# Patient Record
Sex: Female | Born: 1985 | Race: Black or African American | Hispanic: No | Marital: Single | Smoking: Never smoker
Health system: Southern US, Community
[De-identification: ages and names within clinical notes are randomized; demographics above are authoritative.]

## PROBLEM LIST (undated history)

## (undated) DIAGNOSIS — E282 Polycystic ovarian syndrome: Secondary | ICD-10-CM

## (undated) DIAGNOSIS — M51369 Other intervertebral disc degeneration, lumbar region without mention of lumbar back pain or lower extremity pain: Secondary | ICD-10-CM

## (undated) DIAGNOSIS — M5136 Other intervertebral disc degeneration, lumbar region: Secondary | ICD-10-CM

## (undated) HISTORY — PX: LIPOSUCTION: SHX10

## (undated) HISTORY — DX: Other intervertebral disc degeneration, lumbar region: M51.36

## (undated) HISTORY — DX: Other intervertebral disc degeneration, lumbar region without mention of lumbar back pain or lower extremity pain: M51.369

---

## 2007-09-05 ENCOUNTER — Emergency Department (HOSPITAL_COMMUNITY): Admission: EM | Admit: 2007-09-05 | Discharge: 2007-09-05 | Payer: Self-pay | Admitting: Family Medicine

## 2007-09-28 ENCOUNTER — Ambulatory Visit: Payer: Self-pay | Admitting: Internal Medicine

## 2007-10-26 ENCOUNTER — Ambulatory Visit: Payer: Self-pay | Admitting: *Deleted

## 2008-02-20 ENCOUNTER — Ambulatory Visit: Payer: Self-pay | Admitting: Internal Medicine

## 2009-04-30 ENCOUNTER — Emergency Department: Payer: Self-pay | Admitting: Emergency Medicine

## 2009-05-06 ENCOUNTER — Emergency Department: Payer: Self-pay | Admitting: Emergency Medicine

## 2009-09-30 ENCOUNTER — Ambulatory Visit: Payer: Self-pay | Admitting: Family Medicine

## 2009-09-30 ENCOUNTER — Encounter (INDEPENDENT_AMBULATORY_CARE_PROVIDER_SITE_OTHER): Payer: Self-pay | Admitting: Adult Health

## 2009-09-30 LAB — CONVERTED CEMR LAB
Calcium: 8.5 mg/dL (ref 8.4–10.5)
Creatinine, Ser: 0.84 mg/dL (ref 0.40–1.20)
Eosinophils Absolute: 0.1 10*3/uL (ref 0.0–0.7)
Lymphocytes Relative: 19 % (ref 12–46)
MCHC: 33.4 g/dL (ref 30.0–36.0)
MCV: 90.8 fL (ref 78.0–100.0)
Monocytes Relative: 8 % (ref 3–12)
Neutro Abs: 10.4 10*3/uL — ABNORMAL HIGH (ref 1.7–7.7)
Platelets: 303 10*3/uL (ref 150–400)
RBC: 4.65 M/uL (ref 3.87–5.11)
RDW: 13 % (ref 11.5–15.5)
TSH: 2.006 microintl units/mL (ref 0.350–4.500)
Total Bilirubin: 0.4 mg/dL (ref 0.3–1.2)
Total Protein: 7.3 g/dL (ref 6.0–8.3)
Vit D, 25-Hydroxy: 31 ng/mL (ref 30–89)
WBC: 14.3 10*3/uL — ABNORMAL HIGH (ref 4.0–10.5)

## 2009-10-07 ENCOUNTER — Ambulatory Visit (HOSPITAL_COMMUNITY): Admission: RE | Admit: 2009-10-07 | Discharge: 2009-10-07 | Payer: Self-pay | Admitting: Internal Medicine

## 2009-10-23 ENCOUNTER — Emergency Department (HOSPITAL_COMMUNITY): Admission: EM | Admit: 2009-10-23 | Discharge: 2009-10-23 | Payer: Self-pay | Admitting: Emergency Medicine

## 2009-10-23 ENCOUNTER — Emergency Department (HOSPITAL_COMMUNITY): Admission: EM | Admit: 2009-10-23 | Discharge: 2009-10-23 | Payer: Self-pay | Admitting: Family Medicine

## 2009-10-28 ENCOUNTER — Encounter (INDEPENDENT_AMBULATORY_CARE_PROVIDER_SITE_OTHER): Payer: Self-pay | Admitting: Adult Health

## 2009-10-28 ENCOUNTER — Ambulatory Visit: Payer: Self-pay | Admitting: Internal Medicine

## 2009-10-28 LAB — CONVERTED CEMR LAB
Chlamydia, DNA Probe: NEGATIVE
Hgb A1c MFr Bld: 5.8 % — ABNORMAL HIGH (ref <5.7)
Microalb, Ur: 1.86 mg/dL (ref 0.00–1.89)
Testosterone: 77.62 ng/dL — ABNORMAL HIGH (ref 10–70)

## 2010-08-03 LAB — BASIC METABOLIC PANEL
CO2: 28 mEq/L (ref 19–32)
Calcium: 9.4 mg/dL (ref 8.4–10.5)
Chloride: 107 mEq/L (ref 96–112)
GFR calc non Af Amer: >60 mL/min (ref >60)
Potassium: 3.7 mEq/L (ref 3.5–5.1)
Sodium: 140 mEq/L (ref 135–145)

## 2010-08-03 LAB — DIFFERENTIAL
Basophils Relative: 0 % (ref 0–1)
Eosinophils Absolute: 0 10*3/uL (ref 0.0–0.7)
Lymphocytes Relative: 31 % (ref 12–46)
Neutrophils Relative %: 62 % (ref 43–77)

## 2010-08-03 LAB — POCT CARDIAC MARKERS
Myoglobin, poc: 73.1 ng/mL (ref 12–200)
Troponin i, poc: <0.05 ng/mL (ref 0.00–0.09)

## 2010-08-03 LAB — CBC
Hemoglobin: 13.9 g/dL (ref 12.0–15.0)
MCHC: 34 g/dL (ref 30.0–36.0)
MCV: 91.7 fL (ref 78.0–100.0)

## 2011-11-08 ENCOUNTER — Encounter (HOSPITAL_COMMUNITY): Payer: Self-pay

## 2011-11-08 ENCOUNTER — Emergency Department (HOSPITAL_COMMUNITY)
Admission: EM | Admit: 2011-11-08 | Discharge: 2011-11-08 | Disposition: A | Discharge status: Home / Self Care | Payer: Self-pay | Source: Home / Self Care | Attending: Emergency Medicine | Admitting: Emergency Medicine

## 2011-11-08 DIAGNOSIS — S335XXA Sprain of ligaments of lumbar spine, initial encounter: Secondary | ICD-10-CM

## 2011-11-08 DIAGNOSIS — S39012A Strain of muscle, fascia and tendon of lower back, initial encounter: Secondary | ICD-10-CM

## 2011-11-08 MED ORDER — KETOROLAC TROMETHAMINE 60 MG/2ML IM SOLN
INTRAMUSCULAR | Status: AC
  Filled 2011-11-08: qty 2

## 2011-11-08 MED ORDER — PREDNISONE 5 MG PO KIT: 1.0000 | PACK | Freq: Every day | ORAL | Status: DC

## 2011-11-08 MED ORDER — MELOXICAM 15 MG PO TABS: 15.0000 mg | ORAL_TABLET | Freq: Every day | ORAL | Status: AC

## 2011-11-08 MED ORDER — KETOROLAC TROMETHAMINE 60 MG/2ML IM SOLN
60.0000 mg | Freq: Once | INTRAMUSCULAR | Status: AC
  Administered 2011-11-08: 60 mg via INTRAMUSCULAR

## 2011-11-08 MED ORDER — CYCLOBENZAPRINE HCL 5 MG PO TABS: 5.0000 mg | ORAL_TABLET | Freq: Three times a day (TID) | ORAL | Status: AC | PRN

## 2011-11-08 MED ORDER — HYDROCODONE-ACETAMINOPHEN 5-325 MG PO TABS: ORAL_TABLET | ORAL | Status: AC

## 2011-11-08 NOTE — ED Notes (Signed)
C/o pain low back pain off and on for 2 months; pain in low back only, denies any radiation of pain, denies any poss of pregnancy ; looks uncomfortable; minimal relief w absorbine junior
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
## 2011-11-08 NOTE — Discharge Instructions (Signed)
Back Exercises Back exercises help treat and prevent back injuries. The goal of back exercises is to increase the strength of your abdominal and back muscles and the flexibility of your back. These exercises should be started when you no longer have back pain. Back exercises include:  Pelvic Tilt. Lie on your back with your knees bent. Tilt your pelvis until the lower part of your back is against the floor. Hold this position 5 to 10 sec and repeat 5 to 10 times.   Knee to Chest. Pull first 1 knee up against your chest and hold for 20 to 30 seconds, repeat this with the other knee, and then both knees. This may be done with the other leg straight or bent, whichever feels better.   Sit-Ups or Curl-Ups. Bend your knees 90 degrees. Start with tilting your pelvis, and do a partial, slow sit-up, lifting your trunk only 30 to 45 degrees off the floor. Take at least 2 to 3 seconds for each sit-up. Do not do sit-ups with your knees out straight. If partial sit-ups are difficult, simply do the above but with only tightening your abdominal muscles and holding it as directed.   Hip-Lift. Lie on your back with your knees flexed 90 degrees. Push down with your feet and shoulders as you raise your hips a couple inches off the floor; hold for 10 seconds, repeat 5 to 10 times.   Back arches. Lie on your stomach, propping yourself up on bent elbows. Slowly press on your hands, causing an arch in your low back. Repeat 3 to 5 times. Any initial stiffness and discomfort should lessen with repetition over time.   Shoulder-Lifts. Lie face down with arms beside your body. Keep hips and torso pressed to floor as you slowly lift your head and shoulders off the floor.  Do not overdo your exercises, especially in the beginning. Exercises may cause you some mild back discomfort which lasts for a few minutes; however, if the pain is more severe, or lasts for more than 15 minutes, do not continue exercises until you see your  caregiver. Improvement with exercise therapy for back problems is slow.  See your caregivers for assistance with developing a proper back exercise program. Document Released: 06/10/2004 Document Revised: 04/22/2011 Document Reviewed: 05/03/2005 [employer] Patient Information 2012 [employer]. 

## 2012-09-28 ENCOUNTER — Emergency Department (INDEPENDENT_AMBULATORY_CARE_PROVIDER_SITE_OTHER): Payer: No Typology Code available for payment source

## 2012-09-28 ENCOUNTER — Encounter (HOSPITAL_COMMUNITY): Payer: Self-pay | Admitting: *Deleted

## 2012-09-28 ENCOUNTER — Emergency Department (HOSPITAL_COMMUNITY)
Admission: EM | Admit: 2012-09-28 | Discharge: 2012-09-28 | Disposition: A | Discharge status: Home / Self Care | Payer: No Typology Code available for payment source | Source: Home / Self Care | Attending: Emergency Medicine | Admitting: Emergency Medicine

## 2012-09-28 DIAGNOSIS — S63501A Unspecified sprain of right wrist, initial encounter: Secondary | ICD-10-CM

## 2012-09-28 DIAGNOSIS — S63509A Unspecified sprain of unspecified wrist, initial encounter: Secondary | ICD-10-CM

## 2012-09-28 MED ORDER — HYDROCODONE-ACETAMINOPHEN 5-325 MG PO TABS
1.0000 | ORAL_TABLET | Freq: Once | ORAL | Status: AC
  Administered 2012-09-28: 1 via ORAL

## 2012-09-28 MED ORDER — IBUPROFEN 800 MG PO TABS
800.0000 mg | ORAL_TABLET | Freq: Once | ORAL | Status: AC
  Administered 2012-09-28: 800 mg via ORAL

## 2012-09-28 MED ORDER — MELOXICAM 15 MG PO TABS: 15.0000 mg | ORAL_TABLET | Freq: Every day | ORAL | Status: DC

## 2012-09-28 MED ORDER — IBUPROFEN 800 MG PO TABS
ORAL_TABLET | ORAL | Status: AC
  Filled 2012-09-28: qty 1

## 2012-09-28 MED ORDER — OXYCODONE-ACETAMINOPHEN 5-325 MG PO TABS: ORAL_TABLET | ORAL | Status: DC

## 2012-09-28 MED ORDER — HYDROCODONE-ACETAMINOPHEN 5-325 MG PO TABS
ORAL_TABLET | ORAL | Status: AC
  Filled 2012-09-28: qty 1

## 2012-09-28 NOTE — ED Notes (Signed)
[facility]  r   Wrist  Splint

## 2012-09-28 NOTE — ED Notes (Signed)
Pt  Reports  She  Injured  Her  r     Wrist  sev  Days  Ago    When she  Larey Seat      She  Reports    Old  Injury  approx  3  Months  Ago         And  Was  Not  Checked  For that  Injury     Symptoms  Not  releived  By  Wrist  Brace     /  Ice

## 2012-09-28 NOTE — ED Provider Notes (Signed)
Chief Complaint:   Chief Complaint  Patient presents with  . Wrist Pain    History of Present Illness:   Lisa Watson is a 27 year old female who has had a three-month history of intermittent pain in the right wrist. The pain is localized over the dorsum of the wrist both on radial and ulnar side. This began after she fell on outstretched hand on the ice 3 months ago. The wrist hurt intermittently thereafter. She reinjured it 2 days ago when she fell off her bed. The wrist is somewhat swollen. She has a fairly good range of motion. She is able to move her digits well and denies any numbness, tingling, or weakness.  Review of Systems:  Other than noted above, the patient denies any of the following symptoms: Systemic:  No fevers, chills, sweats, or aches.  No fatigue or tiredness. Musculoskeletal:  No joint pain, arthritis, bursitis, swelling, back pain, or neck pain. Neurological:  No muscular weakness, paresthesias, headache, or trouble with speech or coordination.  No dizziness.  PMFSH:  Past medical history, family history, social history, meds, and allergies were reviewed.  She is allergic to tramadol. She is able to take other opioids.  Physical Exam:   Vital signs:  BP 118/72  Pulse 72  Temp(Src) 98.6 F (37 C) (Oral)  Resp 14  SpO2 100%  LMP 09/18/2012 Gen:  Alert and oriented times 3.  In no distress. Musculoskeletal: She has mild pain to palpation of the distal radius and the distal ulna. There is no visible swelling or deformity. Wrist has a full range of motion both actively and passively with minimal pain.  Otherwise, all joints had a full a ROM with no swelling, bruising or deformity.  No edema, pulses full. Extremities were warm and pink.  Capillary refill was brisk.  Skin:  Clear, warm and dry.  No rash. Neuro:  Alert and oriented times 3.  Muscle strength was normal.  Sensation was intact to light touch.   Radiology:  Dg Wrist Complete Right  09/28/2012   *RADIOLOGY  REPORT*  Clinical Data: Status post fall.  Right wrist pain.  RIGHT WRIST - COMPLETE 3+ VIEW  Comparison: None.  Findings: Imaged bones, joints and soft tissues appear normal.  IMPRESSION: Negative study.   Original Report Authenticated By: Holley Dexter, M.D.   I reviewed the images independently and personally and concur with the radiologist's findings.  Course in Urgent Care Center:   Given Norco 5/325 ibuprofen 800 mg by mouth for pain. She was placed in a wrist splint, and should leave this in place for a month except for bathing and showering, then removed and start on some wrist rehabilitation exercises.  Assessment:  The encounter diagnosis was Wrist sprain, right, initial encounter.  Plan:   1.  The following meds were prescribed:   New Prescriptions   MELOXICAM (MOBIC) 15 MG TABLET    Take 1 tablet (15 mg total) by mouth daily.   OXYCODONE-ACETAMINOPHEN (PERCOCET) 5-325 MG PER TABLET    1 to 2 tablets every 6 hours as needed for pain.   2.  The patient was instructed in symptomatic care, including rest and activity, elevation, application of ice and compression.  Appropriate handouts were given. 3.  The patient was told to return if becoming worse in any way, if no better in 3 or 4 days, and given some red flag symptoms such as worsening pain or neurological symptoms that would indicate earlier return.   4.  The patient  was told to follow up with Dr. Betha Loa in one week.    Reuben Likes, MD 09/28/12 [phone]

## 2013-10-09 ENCOUNTER — Encounter (HOSPITAL_COMMUNITY): Payer: Self-pay | Admitting: Emergency Medicine

## 2013-10-09 ENCOUNTER — Emergency Department (INDEPENDENT_AMBULATORY_CARE_PROVIDER_SITE_OTHER)
Admission: EM | Admit: 2013-10-09 | Discharge: 2013-10-09 | Disposition: A | Discharge status: Home / Self Care | Payer: Self-pay | Source: Home / Self Care | Attending: Family Medicine | Admitting: Family Medicine

## 2013-10-09 DIAGNOSIS — R110 Nausea: Secondary | ICD-10-CM

## 2013-10-09 DIAGNOSIS — R109 Unspecified abdominal pain: Secondary | ICD-10-CM

## 2013-10-09 DIAGNOSIS — R509 Fever, unspecified: Secondary | ICD-10-CM

## 2013-10-09 DIAGNOSIS — M791 Myalgia, unspecified site: Secondary | ICD-10-CM

## 2013-10-09 DIAGNOSIS — IMO0001 Reserved for inherently not codable concepts without codable children: Secondary | ICD-10-CM

## 2013-10-09 DIAGNOSIS — E860 Dehydration: Secondary | ICD-10-CM

## 2013-10-09 LAB — POCT URINALYSIS DIP (DEVICE)
Glucose, UA: NEGATIVE mg/dL
HGB URINE DIPSTICK: NEGATIVE
KETONES UR: 40 mg/dL — AB
LEUKOCYTES UA: NEGATIVE
NITRITE: NEGATIVE
PROTEIN: 30 mg/dL — AB
Specific Gravity, Urine: >=1.03 (ref 1.005–1.030)
Urobilinogen, UA: 2 mg/dL — ABNORMAL HIGH (ref 0.0–1.0)
pH: 6 (ref 5.0–8.0)

## 2013-10-09 LAB — POCT PREGNANCY, URINE: Preg Test, Ur: NEGATIVE

## 2013-10-09 MED ORDER — ONDANSETRON 8 MG PO TBDP: 8.0000 mg | ORAL_TABLET | Freq: Three times a day (TID) | ORAL | Status: DC | PRN

## 2013-10-09 MED ORDER — ONDANSETRON HCL 4 MG/2ML IJ SOLN: 4.0000 mg | Freq: Once | INTRAMUSCULAR | Status: DC

## 2013-10-09 MED ORDER — PROMETHAZINE HCL 25 MG PO TABS: 25.0000 mg | ORAL_TABLET | Freq: Four times a day (QID) | ORAL | Status: DC | PRN

## 2013-10-09 MED ORDER — KETOROLAC TROMETHAMINE 30 MG/ML IJ SOLN
INTRAMUSCULAR | Status: AC
  Filled 2013-10-09: qty 1

## 2013-10-09 MED ORDER — KETOROLAC TROMETHAMINE 30 MG/ML IJ SOLN: 30.0000 mg | Freq: Once | INTRAMUSCULAR | Status: DC

## 2013-10-09 MED ORDER — ONDANSETRON HCL 4 MG/2ML IJ SOLN
INTRAMUSCULAR | Status: AC
  Filled 2013-10-09: qty 2

## 2013-10-09 MED ORDER — ONDANSETRON HCL 4 MG/2ML IJ SOLN
4.0000 mg | Freq: Once | INTRAMUSCULAR | Status: AC
  Administered 2013-10-09: 4 mg via INTRAVENOUS

## 2013-10-09 MED ORDER — SODIUM CHLORIDE 0.9 % IV SOLN
Freq: Once | INTRAVENOUS | Status: AC
Start: 2013-10-09 — End: 2013-10-09
  Administered 2013-10-09: 21:00:00 via INTRAVENOUS

## 2013-10-09 MED ORDER — KETOROLAC TROMETHAMINE 30 MG/ML IJ SOLN
30.0000 mg | Freq: Once | INTRAMUSCULAR | Status: AC
  Administered 2013-10-09: 30 mg via INTRAMUSCULAR

## 2013-10-09 NOTE — ED Provider Notes (Signed)
CSN: [id number]     Arrival date & time 10/09/13  1855 History   First MD Initiated Contact with Patient 10/09/13 1932     Chief Complaint  Patient presents with  . Emesis  . Diarrhea   (Consider location/radiation/quality/duration/timing/severity/associated sxs/prior Treatment) HPI  Patient presents with a two-day history of nausea, vomiting, diarrhea, and abdominal pain. This started suddenly after eating picnic dinner of hot dog, watermelon, and chips yesterday. Last night she had vomiting x5 and diarrhea x5 the vomiting was nonbloody nonbilious, and the diarrhea was nonbloody and not dark. It was accompanied by headache, backache, and abdominal pain. She has tried to take Pepto-Bismol and drink sodas a day, but has not been able to. Her father and brother are also sick.  History reviewed. No pertinent past medical history. History reviewed. No pertinent past surgical history. Family History  Problem Relation Age of Onset  . Cataracts Mother   . Heart failure Mother   . Diabetes Father   . Hypertension Father    History  Substance Use Topics  . Smoking status: Never Smoker   . Smokeless tobacco: Not on file  . Alcohol Use: No   OB History   Grav Para Term Preterm Abortions TAB SAB Ect Mult Living                 Review of Systems Positive for headache, fever, chills, abdominal pain, back pain, diarrhea Negative for hematemesis, melena, hematochezia, rash Allergies  Tramadol  Home Medications   Prior to Admission medications   Medication Sig Start Date End Date Taking? Authorizing Provider  bismuth subsalicylate (PEPTO BISMOL) 262 MG/15ML suspension Take 30 mLs by mouth every 6 (six) hours as needed for diarrhea or loose stools.   Yes Historical Provider, MD  meloxicam (MOBIC) 15 MG tablet Take 1 tablet (15 mg total) by mouth daily. 09/28/12   Harden Mo, MD  oxyCODONE-acetaminophen (PERCOCET) 5-325 MG per tablet 1 to 2 tablets every 6 hours as needed for pain.  09/28/12   Harden Mo, MD  PredniSONE 5 MG KIT Take 1 kit (5 mg total) by mouth daily after breakfast. Prednisone 5 mg 6 day dosepack.  Take as directed. 11/08/11   Harden Mo, MD   BP 117/82  Pulse 145  Temp(Src) 100.6 F (38.1 C) (Oral)  Resp 16  SpO2 99%  LMP 09/25/2013 Physical Exam Gen: Young African American female, ill-appearing, but nontoxic, pleasant HEENT: normocephalic atraumatic, oropharynx dry, no pharyngeal exudate, normal range of motion of neck Cardiovascular: sinus tachycardia Lungs: clear to auscultation bilaterally Back: generalized tenderness to palpation of paraspinal muscles and trapezius muscles without deformity Abdomen: soft, tenderness in right lower quadrant, without guarding or rigidity, hyperactive bowel sounds Skin: warm and dry ED Course  Procedures (including critical care time) Labs Review Labs Reviewed  POCT URINALYSIS DIP (DEVICE) - Abnormal; Notable for the following:    Bilirubin Urine SMALL (*)    Ketones, ur 40 (*)    Protein, ur 30 (*)    Urobilinogen, UA 2.0 (*)    All other components within normal limits  POCT PREGNANCY, URINE    Imaging Review No results found.   MDM   1. Dehydration   2. Myalgia   3. Abdominal pain   4. Nausea   5. Fever    Pt with at least 10% dehydration as noted by sinus tachycardia and not able to tolerate PO. Etiology most likely viral gastroenterology. Patient given a bolus of 1L of  normal saline, zofran IV, and toradol 30 mg IM.  She was feeling better and tachycardia improved. She tolerated PO Sprite and was appropriate for discharge.    Angelica Ran, MD 10/09/13 2041  Angelica Ran, MD 10/09/13 2157

## 2013-10-09 NOTE — ED Notes (Signed)
C/o vomiting and diarrhea onset 1800 yesterday.  V x 3, and D x 3-5 yesterday,  Vomiting x 1 and diarrhea x 1 today.  C/o abdominal pain and pain on her "whole back."  She did not know she was febrile until we checked it.  Is able to keep liquids down today.  Orthostatic with her pulses.

## 2013-10-09 NOTE — Discharge Instructions (Signed)
Lisa Watson,   I am glad that you are feeling a little better. Please continue to drink beverages when you get home tonight. You can eat bland foods like soup, crackers and bread. If you still have nausea, then please take the zofran. If that is too expensive, then you can fill the prescription for phenergan. Phenergan can make you very tired, so please do not take it before work or driving.   I hope you feel better soon.   Sincerely,   Dr. Clinton Sawyer

## 2013-10-12 NOTE — ED Provider Notes (Signed)
Medical screening examination/treatment/procedure(s) were performed by resident physician or non-physician practitioner and as supervising physician I was immediately available for consultation/collaboration.   KINDL,JAMES DOUGLAS MD.   James D Kindl, MD 10/12/13 1005 

## 2015-02-23 ENCOUNTER — Encounter: Payer: Self-pay | Admitting: Emergency Medicine

## 2015-02-23 ENCOUNTER — Emergency Department
Admission: EM | Admit: 2015-02-23 | Discharge: 2015-02-24 | Disposition: A | Discharge status: Home / Self Care | Payer: Self-pay | Attending: Student | Admitting: Student

## 2015-02-23 DIAGNOSIS — K219 Gastro-esophageal reflux disease without esophagitis: Secondary | ICD-10-CM

## 2015-02-23 DIAGNOSIS — Z3202 Encounter for pregnancy test, result negative: Secondary | ICD-10-CM | POA: Insufficient documentation

## 2015-02-23 DIAGNOSIS — R1013 Epigastric pain: Secondary | ICD-10-CM

## 2015-02-23 DIAGNOSIS — R52 Pain, unspecified: Secondary | ICD-10-CM

## 2015-02-23 DIAGNOSIS — Z791 Long term (current) use of non-steroidal anti-inflammatories (NSAID): Secondary | ICD-10-CM | POA: Insufficient documentation

## 2015-02-23 LAB — CBC WITH DIFFERENTIAL/PLATELET
Basophils Absolute: 0.1 10*3/uL (ref 0–0.1)
Basophils Relative: 1 %
Eosinophils Absolute: 0.2 10*3/uL (ref 0–0.7)
Eosinophils Relative: 2 %
HEMATOCRIT: 35.1 % (ref 35.0–47.0)
HEMOGLOBIN: 11.4 g/dL — AB (ref 12.0–16.0)
Lymphocytes Relative: 30 %
Lymphs Abs: 2.9 10*3/uL (ref 1.0–3.6)
MCH: 26.2 pg (ref 26.0–34.0)
MCHC: 32.5 g/dL (ref 32.0–36.0)
MCV: 80.6 fL (ref 80.0–100.0)
MONO ABS: 0.6 10*3/uL (ref 0.2–0.9)
Monocytes Relative: 7 %
NEUTROS ABS: 5.9 10*3/uL (ref 1.4–6.5)
Neutrophils Relative %: 60 %
Platelets: 316 10*3/uL (ref 150–440)
RBC: 4.35 MIL/uL (ref 3.80–5.20)
RDW: 16.2 % — AB (ref 11.5–14.5)
WBC: 9.8 10*3/uL (ref 3.6–11.0)

## 2015-02-23 NOTE — ED Notes (Signed)
IV attempt to left AC unsuccessful; able to draw blood but could not thread catheter completely or flush; removed and pressure dressing applied; pt tolerated well

## 2015-02-23 NOTE — ED Notes (Signed)
Pt c/o epigastric pain that started around 830 this am; radiated through to her back intermittently; no nausea/vomiting; denies diarrhea; pt says she "ate a lot today" which didn't make the pain better or worse; no history of similar pain

## 2015-02-23 NOTE — ED Provider Notes (Signed)
[facility] Emergency Department Provider Note  ____________________________________________  Time seen: Approximately 11:34 PM  I have reviewed the triage vital signs and the nursing notes.   HISTORY  Chief Complaint Abdominal Pain    HPI Lisa Watson is a 29 y.o. female with chronic medical problems who presents for evaluation of gradual onset cramping epigastric abdominal pain that began yesterday, intermittent, currently moderate. She reports it improved after she ate some chocolate however it has recurred. No nausea, vomiting, fevers, chills or dysuria. No chest pain, no difficulty breathing. She has never had this before.   History reviewed. No pertinent past medical history.  There are no active problems to display for this patient.   History reviewed. No pertinent past surgical history.  Current Outpatient Rx  Name  Route  Sig  Dispense  Refill  . bismuth subsalicylate (PEPTO BISMOL) 262 MG/15ML suspension   Oral   Take 30 mLs by mouth every 6 (six) hours as needed for diarrhea or loose stools.         . meloxicam (MOBIC) 15 MG tablet   Oral   Take 1 tablet (15 mg total) by mouth daily.   30 tablet   0   . ondansetron (ZOFRAN ODT) 8 MG disintegrating tablet   Oral   Take 1 tablet (8 mg total) by mouth every 8 (eight) hours as needed for nausea or vomiting.   20 tablet   0   . oxyCODONE-acetaminophen (PERCOCET) 5-325 MG per tablet      1 to 2 tablets every 6 hours as needed for pain.   20 tablet   0   . PredniSONE 5 MG KIT   Oral   Take 1 kit (5 mg total) by mouth daily after breakfast. Prednisone 5 mg 6 day dosepack.  Take as directed.   1 kit   0   . promethazine (PHENERGAN) 25 MG tablet   Oral   Take 1 tablet (25 mg total) by mouth every 6 (six) hours as needed for nausea or vomiting.   20 tablet   0     Allergies Tramadol  Family History  Problem Relation Age of Onset  . Cataracts Mother   . Heart failure  Mother   . Diabetes Father   . Hypertension Father     Social History Social History  Substance Use Topics  . Smoking status: Never Smoker   . Smokeless tobacco: None  . Alcohol Use: Yes     Comment: seldom    Review of Systems Constitutional: No fever/chills Eyes: No visual changes. ENT: No sore throat. Cardiovascular: Denies chest pain. Respiratory: Denies shortness of breath. Gastrointestinal: + abdominal pain.  No nausea, no vomiting.  No diarrhea.  No constipation. Genitourinary: Negative for dysuria. Musculoskeletal: Negative for back pain. Skin: Negative for rash. Neurological: Negative for headaches, focal weakness or numbness.  10-point ROS otherwise negative.  ____________________________________________   PHYSICAL EXAM:  VITAL SIGNS: ED Triage Vitals  Enc Vitals Group     BP 02/23/15 2314 139/86 mmHg     Pulse Rate 02/23/15 2314 72     Resp 02/23/15 2314 20     Temp 02/23/15 2314 98.4 F (36.9 C)     Temp Source 02/23/15 2314 Oral     SpO2 02/23/15 2314 100 %     Weight 02/23/15 2314 230 lb (104.327 kg)     Height 02/23/15 2314 _0  (1.702 m)     Head Cir --  Peak Flow --      Pain Score 02/23/15 2315 7     Pain Loc --      Pain Edu? --      Excl. in [place]? --     Constitutional: Alert and oriented. Well appearing and in no acute distress. Eyes: Conjunctivae are normal. PERRL. EOMI. Head: Atraumatic. Nose: No congestion/rhinnorhea. Mouth/Throat: Mucous membranes are moist.  Oropharynx non-erythematous. Neck: No stridor.  Cardiovascular: Normal rate, regular rhythm. Grossly normal heart sounds.  Good peripheral circulation. Respiratory: Normal respiratory effort.  No retractions. Lungs CTAB. Gastrointestinal: Normal bowel sounds. Soft with moderate tenderness to palpation in the epigastrium and the right upper quadrant. No distention. No CVA tenderness. Genitourinary: Deferred Musculoskeletal: No lower extremity tenderness nor edema.  No  joint effusions. Neurologic:  Normal speech and language. No gross focal neurologic deficits are appreciated.  Skin:  Skin is warm, dry and intact. No rash noted. Psychiatric: Mood and affect are normal. Speech and behavior are normal.  ____________________________________________   LABS (all labs ordered are listed, but only abnormal results are displayed)  Labs Reviewed  CBC WITH DIFFERENTIAL/PLATELET - Abnormal; Notable for the following:    Hemoglobin 11.4 (*)    RDW 16.2 (*)    All other components within normal limits  URINALYSIS COMPLETEWITH MICROSCOPIC ([facility] ONLY) - Abnormal; Notable for the following:    Color, Urine YELLOW (*)    APPearance CLEAR (*)    Leukocytes, UA TRACE (*)    Squamous Epithelial / LPF 0-5 (*)    All other components within normal limits  COMPREHENSIVE METABOLIC PANEL - Abnormal; Notable for the following:    Glucose, Bld 108 (*)    Total Bilirubin <0.1 (*)    Anion gap 4 (*)    All other components within normal limits  LIPASE, BLOOD  TROPONIN I  POC URINE PREG, ED  POCT PREGNANCY, URINE   ____________________________________________  EKG  ED ECG REPORT I, Joanne Gavel, the attending physician, personally viewed and interpreted this ECG.   Date: 02/24/2015  EKG Time: 23:19  Rate: 65  Rhythm:  sinus rhythm with marked sinus arrhythmia  Axis: normal  Intervals:none  ST&T Change: No acute ST elevation.  ____________________________________________  RADIOLOGY  Right upper quadrant ultrasound IMPRESSION: Normal examination. ____________________________________________   PROCEDURES  Procedure(s) performed: None  Critical Care performed: No  ____________________________________________   INITIAL IMPRESSION / ASSESSMENT AND PLAN / ED COURSE  Pertinent labs & imaging results that were available during my care of the patient were reviewed by me and considered in my medical decision making (see chart for details).  Lisa Watson is a 29 y.o. female with chronic medical problems who presents for evaluation of gradual onset cramping epigastric abdominal pain that began yesterday, intermittent, currently moderate. On exam, she is very well-appearing and in no acute distress. Vital signs stable, she is afebrile. She does have tenderness to the patient in the epigastrium and right upper quadrant. Suspect possible acute gallbladder pathology vs GERD. Plan for screening labs, RUQ ultrasound, UA and upreg. Will treat symptomatically. Reassess for disposition.  ----------------------------------------- 2:09 AM on 02/24/2015 -----------------------------------------  Labs reviewed. CBC with a mild anemia, hemoglobin 11.4. Troponin negative. Normal CMP. Normal lipase. Negative pregnancy test. Urinalysis not consistent with infection. Right upper quadrant ultrasound negative. Symptoms have improved at this time with GI cocktail. Suspect her symptoms are related to GERD. Discussed return precautions, need for close PCP follow-up and use of omeprazole. She is comfortable with the discharge  plan.   ____________________________________________   FINAL CLINICAL IMPRESSION(S) / ED DIAGNOSES  Final diagnoses:  Abdominal pain, epigastric  Pain  Gastroesophageal reflux disease, esophagitis presence not specified       Joanne Gavel, MD 02/24/15 0210

## 2015-02-24 ENCOUNTER — Emergency Department: Payer: Self-pay

## 2015-02-24 LAB — URINALYSIS COMPLETE WITH MICROSCOPIC (ARMC ONLY)
Bacteria, UA: NONE SEEN
Bilirubin Urine: NEGATIVE
GLUCOSE, UA: NEGATIVE mg/dL
HGB URINE DIPSTICK: NEGATIVE
Ketones, ur: NEGATIVE mg/dL
NITRITE: NEGATIVE
Protein, ur: NEGATIVE mg/dL
SPECIFIC GRAVITY, URINE: 1.029 (ref 1.005–1.030)
pH: 6 (ref 5.0–8.0)

## 2015-02-24 LAB — COMPREHENSIVE METABOLIC PANEL
ALK PHOS: 56 U/L (ref 38–126)
ALT: 18 U/L (ref 14–54)
AST: 19 U/L (ref 15–41)
Albumin: 3.9 g/dL (ref 3.5–5.0)
Anion gap: 4 — ABNORMAL LOW (ref 5–15)
BUN: 12 mg/dL (ref 6–20)
CALCIUM: 9 mg/dL (ref 8.9–10.3)
CO2: 27 mmol/L (ref 22–32)
CREATININE: 0.78 mg/dL (ref 0.44–1.00)
Chloride: 110 mmol/L (ref 101–111)
GFR calc non Af Amer: >60 mL/min (ref >60)
Glucose, Bld: 108 mg/dL — ABNORMAL HIGH (ref 65–99)
Potassium: 4 mmol/L (ref 3.5–5.1)
Sodium: 141 mmol/L (ref 135–145)
TOTAL PROTEIN: 7.1 g/dL (ref 6.5–8.1)

## 2015-02-24 LAB — LIPASE, BLOOD: LIPASE: 27 U/L (ref 22–51)

## 2015-02-24 LAB — POCT PREGNANCY, URINE: PREG TEST UR: NEGATIVE

## 2015-02-24 LAB — TROPONIN I: Troponin I: <0.03 ng/mL (ref <0.031)

## 2015-02-24 MED ORDER — OMEPRAZOLE 20 MG PO CPDR: 20.0000 mg | DELAYED_RELEASE_CAPSULE | Freq: Every day | ORAL | Status: DC

## 2015-02-24 MED ORDER — GI COCKTAIL ~~LOC~~
30.0000 mL | Freq: Once | ORAL | Status: AC
  Administered 2015-02-24: 30 mL via ORAL
  Filled 2015-02-24: qty 30

## 2015-02-24 MED ORDER — ACETAMINOPHEN 500 MG PO TABS
1000.0000 mg | ORAL_TABLET | Freq: Once | ORAL | Status: AC
  Administered 2015-02-24: 1000 mg via ORAL
  Filled 2015-02-24: qty 2

## 2015-02-24 NOTE — ED Notes (Signed)
Patient transported to Ultrasound 

## 2018-05-17 ENCOUNTER — Ambulatory Visit (HOSPITAL_COMMUNITY)
Admission: EM | Admit: 2018-05-17 | Discharge: 2018-05-17 | Disposition: A | Discharge status: Home / Self Care | Payer: Self-pay | Attending: Family Medicine | Admitting: Family Medicine

## 2018-05-17 ENCOUNTER — Other Ambulatory Visit: Payer: Self-pay

## 2018-05-17 ENCOUNTER — Encounter (HOSPITAL_COMMUNITY): Payer: Self-pay | Admitting: Emergency Medicine

## 2018-05-17 DIAGNOSIS — R519 Headache, unspecified: Secondary | ICD-10-CM

## 2018-05-17 DIAGNOSIS — R51 Headache: Secondary | ICD-10-CM | POA: Insufficient documentation

## 2018-05-17 DIAGNOSIS — R6889 Other general symptoms and signs: Secondary | ICD-10-CM

## 2018-05-17 MED ORDER — KETOROLAC TROMETHAMINE 60 MG/2ML IM SOLN
INTRAMUSCULAR | Status: AC
  Filled 2018-05-17: qty 2

## 2018-05-17 MED ORDER — KETOROLAC TROMETHAMINE 60 MG/2ML IM SOLN
60.0000 mg | Freq: Once | INTRAMUSCULAR | Status: AC
  Administered 2018-05-17: 60 mg via INTRAMUSCULAR

## 2018-05-17 MED ORDER — OSELTAMIVIR PHOSPHATE 75 MG PO CAPS: 75.0000 mg | ORAL_CAPSULE | Freq: Two times a day (BID) | ORAL | 0 refills | Status: DC

## 2018-05-17 NOTE — ED Triage Notes (Signed)
Onset today, headache and vomiting, and abdominal pain

## 2018-05-17 NOTE — Discharge Instructions (Addendum)
Toradol shot given in office for headache Get plenty of rest and push fluids Continue with tessalon Perles for cough Tamiflu for flu-like symptoms.  Take as directed and to completion Use OTC medications like ibuprofen or tylenol as needed fever or pain Follow up with PCP or with [facility] if symptoms persist Return or go to ER if you have any new or worsening symptoms fever, chills, nausea, vomiting, chest pain, cough, shortness of breath, wheezing, abdominal pain, changes in bowel or bladder habits, etc...  Discussed with patient and family if symptoms do not improve or worsening within the next 24 hours should go to ED for further evaluation

## 2018-05-17 NOTE — ED Provider Notes (Addendum)
[facility]   [id number] 05/17/18 Arrival Time: 1703   CC: Flu symptoms   SUBJECTIVE: History from: patient and family.  Franciso BendLeaia Rohleder is a 33 y.o. female who presents with abrupt onset of nasal congestion, runny nose, sore throat, cough, HA, nausea, and 2 episodes of vomiting that began 1 day ago.  Admits to positive sick exposure.  Has tried tessalon perles without relief.  Denies aggravating factors.  Reports previous symptoms in the past and diagnosed with viral illness.   Complains of associated chills.  Denies fever, chills, fatigue, sinus pain, SOB, wheezing, chest pain, changes in bowel or bladder habits.    Received flu shot this year: no.  ROS: As per HPI.  History reviewed. No pertinent past medical history. History reviewed. No pertinent surgical history. Allergies  Allergen Reactions  . Tramadol Itching   No current facility-administered medications on file prior to encounter.    Current Outpatient Medications on File Prior to Encounter  Medication Sig Dispense Refill  . benzonatate (TESSALON) 100 MG capsule Take by mouth 3 (three) times daily as needed for cough.    Marland Kitchen. omeprazole (PRILOSEC) 20 MG capsule Take 1 capsule (20 mg total) by mouth daily. 30 capsule 0   Social History   Socioeconomic History  . Marital status: Single    Spouse name: Not on file  . Number of children: Not on file  . Years of education: Not on file  . Highest education level: Not on file  Occupational History  . Not on file  Social Needs  . Financial resource strain: Not on file  . Food insecurity:    Worry: Not on file    Inability: Not on file  . Transportation needs:    Medical: Not on file    Non-medical: Not on file  Tobacco Use  . Smoking status: Never Smoker  Substance and Sexual Activity  . Alcohol use: Yes    Comment: seldom  . Drug use: No  . Sexual activity: Yes    Birth control/protection: None  Lifestyle  . Physical activity:    Days per week:  Not on file    Minutes per session: Not on file  . Stress: Not on file  Relationships  . Social connections:    Talks on phone: Not on file    Gets together: Not on file    Attends religious service: Not on file    Active member of club or organization: Not on file    Attends meetings of clubs or organizations: Not on file    Relationship status: Not on file  . Intimate partner violence:    Fear of current or ex partner: Not on file    Emotionally abused: Not on file    Physically abused: Not on file    Forced sexual activity: Not on file  Other Topics Concern  . Not on file  Social History Narrative  . Not on file   Family History  Problem Relation Age of Onset  . Cataracts Mother   . Heart failure Mother   . Diabetes Father   . Hypertension Father     OBJECTIVE:  Vitals:   05/17/18 1758  BP: 136/86  Pulse: 72  Resp: (!) 28  Temp: (!) 97.2 F (36.2 C)  TempSrc: Oral  SpO2: 100%     General appearance: alert; appears fatigued, laying on exam table with eyes closed, sits up for ENT exam, nontoxic; tolerating own secretions HEENT: NCAT; Ears: EACs clear, TMs  pearly gray; Eyes: PERRL.  EOM grossly intact. Nose: nares patent without rhinorrhea, Throat: oropharynx clear, tonsils non erythematous or enlarged, uvula midline  Neck: supple without LAD Lungs: normal respiratory effort; no respiratory distress; unlabored respirations, symmetrical air entry; cough: absent; no respiratory distress; CTAB Heart: regular rate and rhythm.  Radial pulses 2+ symmetrical bilaterally Abdomen: soft, nondistended, normal active bowel sounds; nontender to palpation; no guarding  Skin: warm and dry Psychological: alert and cooperative; normal mood and affect  ASSESSMENT & PLAN:  1. Flu-like symptoms   2. Acute nonintractable headache, unspecified headache type     Meds ordered this encounter  Medications  . ketorolac (TORADOL) injection 60 mg  . oseltamivir (TAMIFLU) 75 MG capsule      Sig: Take 1 capsule (75 mg total) by mouth every 12 (twelve) hours.    Dispense:  10 capsule    Refill:  0    Order Specific Question:   Supervising Provider    Answer:   Eustace Moore [[id number]]   Toradol shot given in office for headache Get plenty of rest and push fluids Continue with tessalon Perles for cough Tamiflu for flu-like symptoms.  Take as directed and to completion Use OTC medications like ibuprofen or tylenol as needed fever or pain Follow up with PCP or with [facility] if symptoms persist Return or go to ER if you have any new or worsening symptoms fever, chills, nausea, vomiting, chest pain, cough, shortness of breath, wheezing, abdominal pain, changes in bowel or bladder habits, etc...  Discussed with patient and family if symptoms do not improve or worsening within the next 24 hours should go to ED for further evaluation  Reviewed expectations re: course of current medical issues. Questions answered. Outlined signs and symptoms indicating need for more acute intervention. Patient verbalized understanding. After Visit Summary given.         Rennis Harding, PA-C 05/17/18 1907    Rennis Harding, PA-C 05/17/18 1909

## 2018-08-02 ENCOUNTER — Ambulatory Visit (HOSPITAL_COMMUNITY)
Admission: EM | Admit: 2018-08-02 | Discharge: 2018-08-02 | Disposition: A | Discharge status: Home / Self Care | Payer: Self-pay | Attending: Family Medicine | Admitting: Family Medicine

## 2018-08-02 ENCOUNTER — Encounter (HOSPITAL_COMMUNITY): Payer: Self-pay | Admitting: Emergency Medicine

## 2018-08-02 ENCOUNTER — Other Ambulatory Visit: Payer: Self-pay

## 2018-08-02 DIAGNOSIS — H9201 Otalgia, right ear: Secondary | ICD-10-CM

## 2018-08-02 DIAGNOSIS — R6889 Other general symptoms and signs: Secondary | ICD-10-CM

## 2018-08-02 MED ORDER — CHLORHEXIDINE GLUCONATE 0.12 % MT SOLN: 15.0000 mL | Freq: Two times a day (BID) | OROMUCOSAL | 0 refills | Status: DC

## 2018-08-02 MED ORDER — BENZONATATE 100 MG PO CAPS: 200.0000 mg | ORAL_CAPSULE | Freq: Three times a day (TID) | ORAL | 1 refills | Status: DC | PRN

## 2018-08-02 NOTE — ED Provider Notes (Signed)
[facility]    CSN: [id number] Arrival date & time: 08/02/18  1825     History   Chief Complaint Chief Complaint  Patient presents with  . URI    HPI Lisa Watson is a 33 y.o. female.   This is a 33 year old established most: Urgent care patient who presents with 1 week of fever and cough.     History reviewed. No pertinent past medical history.  There are no active problems to display for this patient.   History reviewed. No pertinent surgical history.  OB History   No obstetric history on file.      Home Medications    Prior to Admission medications   Medication Sig Start Date End Date Taking? Authorizing Provider  benzonatate (TESSALON) 100 MG capsule Take 2 capsules (200 mg total) by mouth 3 (three) times daily as needed for cough. 08/02/18   Elvina Sidle, MD  chlorhexidine (PERIDEX) 0.12 % solution Use as directed 15 mLs in the mouth or throat 2 (two) times daily. 08/02/18   Elvina Sidle, MD  omeprazole (PRILOSEC) 20 MG capsule Take 1 capsule (20 mg total) by mouth daily. 02/24/15 02/24/16  Gayla Doss, MD  oseltamivir (TAMIFLU) 75 MG capsule Take 1 capsule (75 mg total) by mouth every 12 (twelve) hours. 05/17/18   Rennis Harding, PA-C    Family History Family History  Problem Relation Age of Onset  . Cataracts Mother   . Heart failure Mother   . Diabetes Father   . Hypertension Father     Social History Social History   Tobacco Use  . Smoking status: Never Smoker  Substance Use Topics  . Alcohol use: Yes    Comment: seldom  . Drug use: No     Allergies   Tramadol   Review of Systems Review of Systems   Physical Exam Triage Vital Signs ED Triage Vitals  Enc Vitals Group     BP      Pulse      Resp      Temp      Temp src      SpO2      Weight      Height      Head Circumference      Peak Flow      Pain Score      Pain Loc      Pain Edu?      Excl. in GC?    No data found.  Updated Vital Signs  BP (!) 137/93 (BP Location: Left Arm) Comment: large cuff  Pulse 79   Temp 98.1 F (36.7 C) (Oral)   Resp 20   SpO2 100%    Physical Exam Vitals signs and nursing note reviewed.  Constitutional:      Appearance: Normal appearance.  HENT:     Right Ear: Tympanic membrane normal.     Left Ear: Tympanic membrane normal.     Nose: Nose normal.     Mouth/Throat:     Mouth: Mucous membranes are moist.     Comments: Right tonsillar concretion Eyes:     Conjunctiva/sclera: Conjunctivae normal.  Neck:     Musculoskeletal: Normal range of motion and neck supple.  Cardiovascular:     Rate and Rhythm: Normal rate and regular rhythm.     Pulses: Normal pulses.     Heart sounds: Normal heart sounds.  Pulmonary:     Effort: Pulmonary effort is normal.     Breath sounds: Normal  breath sounds.  Musculoskeletal: Normal range of motion.  Skin:    General: Skin is warm and dry.  Neurological:     General: No focal deficit present.     Mental Status: She is alert and oriented to person, place, and time.  Psychiatric:        Mood and Affect: Mood normal.        Behavior: Behavior normal.      UC Treatments / Results  Labs (all labs ordered are listed, but only abnormal results are displayed) Labs Reviewed - No data to display  EKG None  Radiology No results found.  Procedures Procedures (including critical care time)  Medications Ordered in UC Medications - No data to display  Initial Impression / Assessment and Plan / UC Course  I have reviewed the triage vital signs and the nursing notes.  Pertinent labs & imaging results that were available during my care of the patient were reviewed by me and considered in my medical decision making (see chart for details).    Final Clinical Impressions(s) / UC Diagnoses   Final diagnoses:  Flu-like symptoms  Otalgia of right ear     Discharge Instructions     Gargle twice daily for a week.  Expect symptoms to resolve over  the next several days    ED Prescriptions    Medication Sig Dispense Auth. Provider   benzonatate (TESSALON) 100 MG capsule Take 2 capsules (200 mg total) by mouth 3 (three) times daily as needed for cough. 20 capsule Elvina Sidle, MD   chlorhexidine (PERIDEX) 0.12 % solution Use as directed 15 mLs in the mouth or throat 2 (two) times daily. 120 mL Elvina Sidle, MD     Controlled Substance Prescriptions [place] Controlled Substance Registry consulted? Not Applicable   Elvina Sidle, MD 08/02/18 [phone]

## 2018-08-02 NOTE — Discharge Instructions (Addendum)
Gargle twice daily for a week.  Expect symptoms to resolve over the next several days

## 2018-08-02 NOTE — ED Triage Notes (Signed)
Dr Milus Glazier in room with nurse

## 2018-10-08 ENCOUNTER — Other Ambulatory Visit: Payer: Self-pay

## 2018-10-08 ENCOUNTER — Ambulatory Visit (HOSPITAL_COMMUNITY)
Admission: EM | Admit: 2018-10-08 | Discharge: 2018-10-08 | Disposition: A | Discharge status: Home / Self Care | Payer: Self-pay | Attending: Family Medicine | Admitting: Family Medicine

## 2018-10-08 ENCOUNTER — Encounter (HOSPITAL_COMMUNITY): Payer: Self-pay | Admitting: Emergency Medicine

## 2018-10-08 DIAGNOSIS — G43019 Migraine without aura, intractable, without status migrainosus: Secondary | ICD-10-CM

## 2018-10-08 HISTORY — DX: Polycystic ovarian syndrome: E28.2

## 2018-10-08 MED ORDER — KETOROLAC TROMETHAMINE 60 MG/2ML IM SOLN
60.0000 mg | Freq: Once | INTRAMUSCULAR | Status: AC
  Administered 2018-10-08: 16:00:00 60 mg via INTRAMUSCULAR

## 2018-10-08 MED ORDER — ALUM & MAG HYDROXIDE-SIMETH 200-200-20 MG/5ML PO SUSP
ORAL | Status: AC
  Filled 2018-10-08: qty 30

## 2018-10-08 MED ORDER — ALUM & MAG HYDROXIDE-SIMETH 200-200-20 MG/5ML PO SUSP
30.0000 mL | Freq: Once | ORAL | Status: AC
  Administered 2018-10-08: 16:00:00 30 mL via ORAL

## 2018-10-08 MED ORDER — METOCLOPRAMIDE HCL 5 MG/ML IJ SOLN
5.0000 mg | Freq: Once | INTRAMUSCULAR | Status: AC
  Administered 2018-10-08: 16:00:00 5 mg via INTRAMUSCULAR

## 2018-10-08 MED ORDER — LIDOCAINE VISCOUS HCL 2 % MT SOLN
OROMUCOSAL | Status: AC
  Filled 2018-10-08: qty 15

## 2018-10-08 MED ORDER — LIDOCAINE VISCOUS HCL 2 % MT SOLN
15.0000 mL | Freq: Once | OROMUCOSAL | Status: AC
  Administered 2018-10-08: 16:00:00 15 mL via ORAL

## 2018-10-08 MED ORDER — KETOROLAC TROMETHAMINE 60 MG/2ML IM SOLN
INTRAMUSCULAR | Status: AC
  Filled 2018-10-08: qty 2

## 2018-10-08 MED ORDER — METOCLOPRAMIDE HCL 5 MG/ML IJ SOLN
INTRAMUSCULAR | Status: AC
  Filled 2018-10-08: qty 2

## 2018-10-08 MED ORDER — SUMATRIPTAN SUCCINATE 50 MG PO TABS: ORAL_TABLET | ORAL | 0 refills | Status: DC

## 2018-10-08 MED ORDER — ONDANSETRON HCL 4 MG PO TABS: 4.0000 mg | ORAL_TABLET | Freq: Four times a day (QID) | ORAL | 0 refills | Status: DC

## 2018-10-08 NOTE — ED Provider Notes (Signed)
[facility]    CSN: [id number] Arrival date & time: 10/08/18  1456     History   Chief Complaint Chief Complaint  Patient presents with  . Headache  . Abdominal Pain    HPI Lisa Watson is a 33 y.o. female.   HPI  Patient has a history of migraine headaches.  She is here for what she considers to be a migraine.  Started yesterday.  She took some Excedrin and thought she was feeling better.  After this she did throw up.  She was able to sleep some last night but when she woke up this morning she still has a headache.  Is behind her eyes.  No visual changes.  She still has some nausea but no vomiting today.  She has some epigastric pain.  She states she also took a couple doses of Aleve for her headache.  She states that Aleve does not usually upset her stomach. Previously diagnosed with GERD.  Previously took omeprazole.  She states that she has not been on this for some time. Denies alcohol.  Past Medical History:  Diagnosis Date  . PCOS (polycystic ovarian syndrome)     There are no active problems to display for this patient.   History reviewed. No pertinent surgical history.  OB History   No obstetric history on file.      Home Medications    Prior to Admission medications   Medication Sig Start Date End Date Taking? Authorizing Provider  ondansetron (ZOFRAN) 4 MG tablet Take 1 tablet (4 mg total) by mouth every 6 (six) hours. As needed nausea/vomiting 10/08/18   Eustace Moore, MD  SUMAtriptan (IMITREX) 50 MG tablet Take one at first sign of migraine.  Repeat in 2 hours if needed 10/08/18   Eustace Moore, MD    Family History Family History  Problem Relation Age of Onset  . Cataracts Mother   . Heart failure Mother   . Diabetes Father   . Hypertension Father     Social History Social History   Tobacco Use  . Smoking status: Never Smoker  . Smokeless tobacco: Never Used  Substance Use Topics  . Alcohol use: Yes    Comment:  seldom  . Drug use: No     Allergies   Tramadol   Review of Systems Review of Systems  Constitutional: Negative for chills and fever.  HENT: Negative for ear pain and sore throat.   Eyes: Negative for pain and visual disturbance.  Respiratory: Negative for cough and shortness of breath.   Cardiovascular: Negative for chest pain and palpitations.  Gastrointestinal: Positive for abdominal pain, nausea and vomiting.  Genitourinary: Negative for dysuria and hematuria.  Musculoskeletal: Negative for arthralgias and back pain.  Skin: Negative for color change and rash.  Neurological: Positive for headaches. Negative for seizures and syncope.  All other systems reviewed and are negative.    Physical Exam Triage Vital Signs ED Triage Vitals  Enc Vitals Group     BP 10/08/18 1515 (!) 144/101     Pulse Rate 10/08/18 1515 100     Resp 10/08/18 1515 18     Temp 10/08/18 1515 98.5 F (36.9 C)     Temp Source 10/08/18 1515 Oral     SpO2 10/08/18 1515 99 %     Weight --      Height --      Head Circumference --      Peak Flow --  Pain Score 10/08/18 1511 7     Pain Loc --      Pain Edu? --      Excl. in GC? --    No data found.  Updated Vital Signs BP (!) 144/101 (BP Location: Right Arm)   Pulse 100   Temp 98.5 F (36.9 C) (Oral)   Resp 18   LMP 09/28/2018   SpO2 99%   Physical Exam Constitutional:      General: She is not in acute distress.    Appearance: She is well-developed. She is obese.     Comments: Appears mildly uncomfortable  HENT:     Head: Normocephalic and atraumatic.     Mouth/Throat:     Mouth: Mucous membranes are moist.  Eyes:     General: No visual field deficit.    Extraocular Movements: Extraocular movements intact.     Conjunctiva/sclera: Conjunctivae normal.     Pupils: Pupils are equal, round, and reactive to light.     Right eye: Pupil is round and reactive.     Left eye: Pupil is round and reactive.  Neck:     Musculoskeletal:  Normal range of motion. No neck rigidity.  Cardiovascular:     Rate and Rhythm: Normal rate and regular rhythm.     Heart sounds: Normal heart sounds.  Pulmonary:     Effort: Pulmonary effort is normal. No respiratory distress.     Breath sounds: Normal breath sounds. No wheezing.  Abdominal:     General: Bowel sounds are normal. There is no distension.     Palpations: Abdomen is soft.     Tenderness: There is no abdominal tenderness.     Comments: No tenderness to palpation  Musculoskeletal: Normal range of motion.  Lymphadenopathy:     Cervical: No cervical adenopathy.  Skin:    General: Skin is warm and dry.  Neurological:     Mental Status: She is alert. Mental status is at baseline.     Cranial Nerves: No cranial nerve deficit, dysarthria or facial asymmetry.     Motor: No weakness.     Coordination: Coordination normal.     Gait: Gait normal.     Deep Tendon Reflexes: Reflexes normal.  Psychiatric:        Mood and Affect: Mood normal.        Behavior: Behavior normal.      UC Treatments / Results  Labs (all labs ordered are listed, but only abnormal results are displayed) Labs Reviewed - No data to display  EKG None  Radiology No results found.  Procedures Procedures (including critical care time)  Medications Ordered in UC Medications  ketorolac (TORADOL) injection 60 mg (60 mg Intramuscular Given 10/08/18 1545)  metoCLOPramide (REGLAN) injection 5 mg (5 mg Intramuscular Given 10/08/18 1545)  alum & mag hydroxide-simeth (MAALOX/MYLANTA) 200-200-20 MG/5ML suspension 30 mL (30 mLs Oral Given 10/08/18 1543)    And  lidocaine (XYLOCAINE) 2 % viscous mouth solution 15 mL (15 mLs Oral Given 10/08/18 1543)    Initial Impression / Assessment and Plan / UC Course  I have reviewed the triage vital signs and the nursing notes.  Pertinent labs & imaging results that were available during my care of the patient were reviewed by me and considered in my medical decision  making (see chart for details).    I discussed with the patient home treatment of migraine.  She states she used to take the medicine that would help stop her headache.  Does remember the name.  Will give her a trial of Imitrex to see if this will help her.  She should follow-up with the PCP.  Zofran for nausea.  Her physical examination is unremarkable and her symptoms are improving with medication.  She is discharged home  Final Clinical Impressions(s) / UC Diagnoses   Final diagnoses:  Intractable migraine without aura and without status migrainosus     Discharge Instructions     Home to rest today At the first sign of a migraine try Imitrex.  This usually helps to stop it before he gets severe I have prescribed Zofran to keep at home.  This is for nausea and vomiting. You may still continue to use Excedrin Migraine as needed Follow-up with your primary care doctor    ED Prescriptions    Medication Sig Dispense Auth. Provider   ondansetron (ZOFRAN) 4 MG tablet Take 1 tablet (4 mg total) by mouth every 6 (six) hours. As needed nausea/vomiting 12 tablet Eustace Moore, MD   SUMAtriptan (IMITREX) 50 MG tablet Take one at first sign of migraine.  Repeat in 2 hours if needed 10 tablet Eustace Moore, MD     Controlled Substance Prescriptions [place] Controlled Substance Registry consulted? Not Applicable   Eustace Moore, MD 10/08/18 [phone]

## 2018-10-08 NOTE — Discharge Instructions (Signed)
Home to rest today At the first sign of a migraine try Imitrex.  This usually helps to stop it before he gets severe I have prescribed Zofran to keep at home.  This is for nausea and vomiting. You may still continue to use Excedrin Migraine as needed Follow-up with your primary care doctor

## 2018-10-08 NOTE — ED Triage Notes (Signed)
Pt presents to [facility] for assessment epigastric abdominal pain and headache since yesterday.  Pt denies body aches or fevers.  When asked about n/v patient states "I think I threw up yesterday".  Denies diarrhea or constipation.  Denies changes in urination.  Denies shortness of breath.

## 2019-11-26 ENCOUNTER — Encounter (HOSPITAL_COMMUNITY): Payer: Self-pay

## 2019-11-26 ENCOUNTER — Ambulatory Visit (HOSPITAL_COMMUNITY)
Admission: EM | Admit: 2019-11-26 | Discharge: 2019-11-26 | Disposition: A | Discharge status: Home / Self Care | Payer: Self-pay | Attending: Physician Assistant | Admitting: Physician Assistant

## 2019-11-26 ENCOUNTER — Other Ambulatory Visit: Payer: Self-pay

## 2019-11-26 DIAGNOSIS — Z3202 Encounter for pregnancy test, result negative: Secondary | ICD-10-CM

## 2019-11-26 DIAGNOSIS — M549 Dorsalgia, unspecified: Secondary | ICD-10-CM

## 2019-11-26 DIAGNOSIS — M545 Low back pain, unspecified: Secondary | ICD-10-CM

## 2019-11-26 DIAGNOSIS — G43809 Other migraine, not intractable, without status migrainosus: Secondary | ICD-10-CM

## 2019-11-26 LAB — POC URINE PREG, ED: Preg Test, Ur: NEGATIVE

## 2019-11-26 MED ORDER — TIZANIDINE HCL 4 MG PO TABS: 4.0000 mg | ORAL_TABLET | Freq: Three times a day (TID) | ORAL | 0 refills | Status: AC | PRN

## 2019-11-26 MED ORDER — KETOROLAC TROMETHAMINE 60 MG/2ML IM SOLN
INTRAMUSCULAR | Status: AC
  Filled 2019-11-26: qty 2

## 2019-11-26 MED ORDER — IBUPROFEN 600 MG PO TABS: 600.0000 mg | ORAL_TABLET | Freq: Four times a day (QID) | ORAL | 0 refills | Status: DC | PRN

## 2019-11-26 MED ORDER — ONDANSETRON HCL 4 MG PO TABS: 4.0000 mg | ORAL_TABLET | Freq: Four times a day (QID) | ORAL | 0 refills | Status: DC

## 2019-11-26 MED ORDER — SUMATRIPTAN SUCCINATE 50 MG PO TABS: ORAL_TABLET | ORAL | 0 refills | Status: DC

## 2019-11-26 MED ORDER — KETOROLAC TROMETHAMINE 60 MG/2ML IM SOLN
60.0000 mg | Freq: Once | INTRAMUSCULAR | Status: AC
  Administered 2019-11-26: 60 mg via INTRAMUSCULAR

## 2019-11-26 NOTE — ED Triage Notes (Signed)
Pt c/o 7/10 lower back painx1 wk. Pt states a dr gave her methocarbinol for pain, but it's not helping. Pt denies urinary issues. Pt states lower back goes numb at times. Pt c/o HA today. Pt denies blurred vision or light sensitivity.

## 2019-11-26 NOTE — Discharge Instructions (Signed)
Take the ibuprofen for back pain and headache, wait until 6 hours post shot today Take zanaflex every 8 hours as needed, this is a muscle relaxer, do not drive or drink when taking - stop the methacarbamol  Take imitrex at first sign of future headaches Take zofran for nausea  Establish with a primary care provider, I have given an option

## 2019-11-26 NOTE — ED Provider Notes (Signed)
[facility]    CSN: [id number] Arrival date & time: 11/26/19  1436      History   Chief Complaint Chief Complaint  Patient presents with  . Back Pain    HPI Lisa Watson is a 34 y.o. female.   Patient reports for evaluation of lower back pain.  She is also reporting a migraine headache today that is consistent with her previous migraine headaches.  She reports she has had back pain for over a week.  She reports she saw a provider in urgent care at an outside facility and was given methocarbamol but this did not seem to help her back pain.  She was recommended to take 600 mg ibuprofen however this was not as prescribed so she has not taken that.  She reports the back pain is not worse but is not improved.  She denies that shooting in her legs.  Denies numbness or tingling.  Denies urinary symptoms of frequency urgency or painful urination.  Denies vaginal discharge.  She reports her headache is consistent with previous migraines and notes that is frontal in nature.  Denies photophobia or pain with loud sounds.  She has had some nausea from the headache.  She states typically Excedrin helps the headache.  She has not taken any of this today.  She has not had any fevers or chills.  Denies any vision changes.  She reports last year when she had a migraine she was given Imitrex from this clinic and that helped her a lot.  She also reports Toradol shots help a lot.     Past Medical History:  Diagnosis Date  . PCOS (polycystic ovarian syndrome)     There are no problems to display for this patient.   History reviewed. No pertinent surgical history.  OB History   No obstetric history on file.      Home Medications    Prior to Admission medications   Medication Sig Start Date End Date Taking? Authorizing Provider  ibuprofen (ADVIL) 600 MG tablet Take 1 tablet (600 mg total) by mouth every 6 (six) hours as needed. 11/26/19   Deondra Wigger, Veryl Speak, PA-C  ondansetron (ZOFRAN)  4 MG tablet Take 1 tablet (4 mg total) by mouth every 6 (six) hours. As needed nausea/vomiting 11/26/19   Eligha Kmetz, Veryl Speak, PA-C  SUMAtriptan (IMITREX) 50 MG tablet Take one at first sign of migraine.  Repeat in 2 hours if needed 11/26/19   Tallie Dodds, Veryl Speak, PA-C  tiZANidine (ZANAFLEX) 4 MG tablet Take 1 tablet (4 mg total) by mouth every 8 (eight) hours as needed for up to 7 days for muscle spasms. 11/26/19 12/03/19  Duwan Adrian, Veryl Speak, PA-C    Family History Family History  Problem Relation Age of Onset  . Cataracts Mother   . Heart failure Mother   . Diabetes Father   . Hypertension Father     Social History Social History   Tobacco Use  . Smoking status: Never Smoker  . Smokeless tobacco: Never Used  Substance Use Topics  . Alcohol use: Not Currently    Comment: seldom  . Drug use: No     Allergies   Tramadol   Review of Systems Review of Systems   Physical Exam Triage Vital Signs ED Triage Vitals  Enc Vitals Group     BP 11/26/19 1530 (!) 141/100     Pulse Rate 11/26/19 1530 97     Resp 11/26/19 1530 16     Temp 11/26/19  1530 98.8 F (37.1 C)     Temp Source 11/26/19 1530 Oral     SpO2 11/26/19 1530 99 %     Weight 11/26/19 1534 260 lb (117.9 kg)     Height 11/26/19 1534 5\' 6"  (1.676 m)     Head Circumference --      Peak Flow --      Pain Score 11/26/19 1533 7     Pain Loc --      Pain Edu? --      Excl. in GC? --    No data found.  Updated Vital Signs BP (!) 141/100   Pulse 97   Temp 98.8 F (37.1 C) (Oral)   Resp 16   Ht 5\' 6"  (1.676 m)   Wt 260 lb (117.9 kg)   SpO2 99%   BMI 41.97 kg/m   Visual Acuity Right Eye Distance:   Left Eye Distance:   Bilateral Distance:    Right Eye Near:   Left Eye Near:    Bilateral Near:     Physical Exam Vitals and nursing note reviewed.  Constitutional:      General: She is not in acute distress.    Appearance: She is well-developed. She is not ill-appearing.  HENT:     Head: Normocephalic and atraumatic.      Nose: Nose normal.     Mouth/Throat:     Mouth: Mucous membranes are moist.  Eyes:     Extraocular Movements: Extraocular movements intact.     Conjunctiva/sclera: Conjunctivae normal.     Pupils: Pupils are equal, round, and reactive to light.  Cardiovascular:     Rate and Rhythm: Normal rate and regular rhythm.     Heart sounds: No murmur heard.   Pulmonary:     Effort: Pulmonary effort is normal. No respiratory distress.     Breath sounds: Normal breath sounds.  Abdominal:     Palpations: Abdomen is soft.     Tenderness: There is no abdominal tenderness. There is no right CVA tenderness or left CVA tenderness.  Musculoskeletal:     Cervical back: Neck supple.     Right lower leg: No edema.     Left lower leg: No edema.     Comments: Tenderness to palpation primarily in the right lumbar paraspinal musculature.  No midline tenderness.  Full range of motion of the low back and thoracic spine.  Straight leg raise negative.  Patient is ambulatory without issue.  Skin:    General: Skin is warm and dry.  Neurological:     General: No focal deficit present.     Mental Status: She is alert and oriented to person, place, and time.     Cranial Nerves: No cranial nerve deficit.     Sensory: No sensory deficit.     Motor: No weakness.     Coordination: Coordination normal.     Gait: Gait normal.      UC Treatments / Results  Labs (all labs ordered are listed, but only abnormal results are displayed) Labs Reviewed  POC URINE PREG, ED    EKG   Radiology No results found.  Procedures Procedures (including critical care time)  Medications Ordered in UC Medications  ketorolac (TORADOL) injection 60 mg (60 mg Intramuscular Given 11/26/19 1648)    Initial Impression / Assessment and Plan / UC Course  I have reviewed the triage vital signs and the nursing notes.  Pertinent labs & imaging results that were available during my  care of the patient were reviewed by me and  considered in my medical decision making (see chart for details).     #Acute right-sided low back pain #Migraine Patient is 34 year old presenting with acute low back pain and migraine.  Urine pregnancy negative.  No red flag symptoms.  Given severe migraine and this is consistent with previous we will give her shot of Toradol here to help with both headache as well as low back pain.  Muscle relaxer switch to Zanaflex, instructed to stop methocarbamol.  Recommend use of ibuprofen for back pain and headache.  Imitrex prescribed.  Primary care follow-up option given.  Strict return emergency department precautions were discussed.  Patient verbalized understanding plan of care. Final Clinical Impressions(s) / UC Diagnoses   Final diagnoses:  Acute right-sided low back pain without sciatica  Other migraine without status migrainosus, not intractable     Discharge Instructions     Take the ibuprofen for back pain and headache, wait until 6 hours post shot today Take zanaflex every 8 hours as needed, this is a muscle relaxer, do not drive or drink when taking - stop the methacarbamol  Take imitrex at first sign of future headaches Take zofran for nausea  Establish with a primary care provider, I have given an option      ED Prescriptions    Medication Sig Dispense Auth. Provider   SUMAtriptan (IMITREX) 50 MG tablet Take one at first sign of migraine.  Repeat in 2 hours if needed 10 tablet Jeffory Snelgrove, Veryl Speak, PA-C   tiZANidine (ZANAFLEX) 4 MG tablet Take 1 tablet (4 mg total) by mouth every 8 (eight) hours as needed for up to 7 days for muscle spasms. 21 tablet Takota Cahalan, Veryl Speak, PA-C   ibuprofen (ADVIL) 600 MG tablet Take 1 tablet (600 mg total) by mouth every 6 (six) hours as needed. 30 tablet Matthan Sledge, Veryl Speak, PA-C   ondansetron (ZOFRAN) 4 MG tablet Take 1 tablet (4 mg total) by mouth every 6 (six) hours. As needed nausea/vomiting 4 tablet Delmas Faucett, Veryl Speak, PA-C     PDMP not reviewed this  encounter.   Hermelinda Medicus, PA-C 11/26/19 2203

## 2020-06-10 ENCOUNTER — Other Ambulatory Visit: Payer: Self-pay

## 2020-06-10 ENCOUNTER — Ambulatory Visit (HOSPITAL_COMMUNITY)
Admission: EM | Admit: 2020-06-10 | Discharge: 2020-06-10 | Disposition: A | Discharge status: Home / Self Care | Payer: HRSA Program | Attending: Student | Admitting: Student

## 2020-06-10 ENCOUNTER — Encounter (HOSPITAL_COMMUNITY): Payer: Self-pay | Admitting: Emergency Medicine

## 2020-06-10 DIAGNOSIS — R059 Cough, unspecified: Secondary | ICD-10-CM

## 2020-06-10 DIAGNOSIS — U071 COVID-19: Secondary | ICD-10-CM | POA: Diagnosis not present

## 2020-06-10 MED ORDER — BENZONATATE 100 MG PO CAPS: 100.0000 mg | ORAL_CAPSULE | Freq: Three times a day (TID) | ORAL | 0 refills | Status: DC

## 2020-06-10 MED ORDER — PROMETHAZINE-DM 6.25-15 MG/5ML PO SYRP: 5.0000 mL | ORAL_SOLUTION | Freq: Four times a day (QID) | ORAL | 0 refills | Status: DC | PRN

## 2020-06-10 NOTE — ED Triage Notes (Signed)
Pt presents with dry cough and headache. States tested positive for COVID yesterday. States has been using OTC cough medications and states currently taking Keflex for recent surgery.

## 2020-06-10 NOTE — ED Provider Notes (Signed)
[facility]    CSN: [id number] Arrival date & time: 06/10/20  1406      History   Chief Complaint Chief Complaint  Patient presents with  . Cough  . Headache  . Covid Positive    HPI Lisa Watson is a 35 y.o. female Presenting for URI symptoms for 2 days. She is covid positive, tested positive 06/09/2020. States she's been having symptoms for 10 days. Endorses dry cough and headaches. Denies history cardiopulmonary disease. Has been taking OTC medications with improvement. Also taking Keflex for recent surgery. Denies fevers/chills, n/v/d, shortness of breath, chest pain, facial pain, teeth pain, , sore throat, loss of taste/smell, swollen lymph nodes, ear pain. Denies chest pain, shortness of breath, confusion, high fevers. States she had liposuction recently and is healing well from this.    HPI  Past Medical History:  Diagnosis Date  . PCOS (polycystic ovarian syndrome)     There are no problems to display for this patient.   History reviewed. No pertinent surgical history.  OB History   No obstetric history on file.      Home Medications    Prior to Admission medications   Medication Sig Start Date End Date Taking? Authorizing Provider  benzonatate (TESSALON) 100 MG capsule Take 1 capsule (100 mg total) by mouth every 8 (eight) hours. 06/10/20  Yes Rhys Martini, PA-C  promethazine-dextromethorphan (PROMETHAZINE-DM) 6.25-15 MG/5ML syrup Take 5 mLs by mouth 4 (four) times daily as needed for cough. 06/10/20  Yes Rhys Martini, PA-C  ibuprofen (ADVIL) 600 MG tablet Take 1 tablet (600 mg total) by mouth every 6 (six) hours as needed. 11/26/19   Darr, Gerilyn Pilgrim, PA-C  ondansetron (ZOFRAN) 4 MG tablet Take 1 tablet (4 mg total) by mouth every 6 (six) hours. As needed nausea/vomiting 11/26/19   Darr, Gerilyn Pilgrim, PA-C  SUMAtriptan (IMITREX) 50 MG tablet Take one at first sign of migraine.  Repeat in 2 hours if needed 11/26/19   Darr, Gerilyn Pilgrim, PA-C    Family  History Family History  Problem Relation Age of Onset  . Cataracts Mother   . Heart failure Mother   . Diabetes Father   . Hypertension Father     Social History Social History   Tobacco Use  . Smoking status: Never Smoker  . Smokeless tobacco: Never Used  Substance Use Topics  . Alcohol use: Not Currently    Comment: seldom  . Drug use: No     Allergies   Tramadol   Review of Systems Review of Systems  Constitutional: Negative for appetite change, chills and fever.  HENT: Negative for congestion, ear pain, rhinorrhea, sinus pressure, sinus pain and sore throat.   Eyes: Negative for redness and visual disturbance.  Respiratory: Positive for cough. Negative for chest tightness, shortness of breath and wheezing.   Cardiovascular: Negative for chest pain and palpitations.  Gastrointestinal: Negative for abdominal pain, constipation, diarrhea, nausea and vomiting.  Genitourinary: Negative for dysuria, frequency and urgency.  Musculoskeletal: Negative for myalgias.  Neurological: Positive for headaches. Negative for dizziness and weakness.  Psychiatric/Behavioral: Negative for confusion.  All other systems reviewed and are negative.    Physical Exam Triage Vital Signs ED Triage Vitals [06/10/20 1433]  Enc Vitals Group     BP      Pulse      Resp      Temp      Temp src      SpO2      Weight  Height      Head Circumference      Peak Flow      Pain Score 5     Pain Loc      Pain Edu?      Excl. in GC?    No data found.  Updated Vital Signs BP (!) 137/97 (BP Location: Right Arm)   Pulse 94   Temp 98.4 F (36.9 C) (Oral)   Resp 17   LMP 06/05/2020   SpO2 100%   Visual Acuity Right Eye Distance:   Left Eye Distance:   Bilateral Distance:    Right Eye Near:   Left Eye Near:    Bilateral Near:     Physical Exam Vitals reviewed.  Constitutional:      General: She is not in acute distress.    Appearance: Normal appearance. She is not  ill-appearing.  HENT:     Head: Normocephalic and atraumatic.     Right Ear: Hearing, tympanic membrane, ear canal and external ear normal. No swelling or tenderness. There is no impacted cerumen. No mastoid tenderness. Tympanic membrane is not perforated, erythematous, retracted or bulging.     Left Ear: Hearing, tympanic membrane, ear canal and external ear normal. No swelling or tenderness. There is no impacted cerumen. No mastoid tenderness. Tympanic membrane is not perforated, erythematous, retracted or bulging.     Nose:     Right Sinus: No maxillary sinus tenderness or frontal sinus tenderness.     Left Sinus: No maxillary sinus tenderness or frontal sinus tenderness.     Mouth/Throat:     Mouth: Mucous membranes are moist.     Pharynx: Uvula midline. No oropharyngeal exudate or posterior oropharyngeal erythema.     Tonsils: No tonsillar exudate.  Cardiovascular:     Rate and Rhythm: Normal rate and regular rhythm.     Heart sounds: Normal heart sounds.  Pulmonary:     Breath sounds: Normal air entry. No decreased breath sounds, wheezing, rhonchi or rales.     Comments: Frequently coughing Chest:     Chest wall: No tenderness.  Abdominal:     General: Abdomen is flat. Bowel sounds are normal.     Tenderness: There is no abdominal tenderness. There is no guarding or rebound.  Lymphadenopathy:     Cervical: No cervical adenopathy.  Neurological:     General: No focal deficit present.     Mental Status: She is alert and oriented to person, place, and time.  Psychiatric:        Attention and Perception: Attention and perception normal.        Mood and Affect: Mood and affect normal.        Behavior: Behavior normal. Behavior is cooperative.        Thought Content: Thought content normal.        Judgment: Judgment normal.      UC Treatments / Results  Labs (all labs ordered are listed, but only abnormal results are displayed) Labs Reviewed - No data to  display  EKG   Radiology No results found.  Procedures Procedures (including critical care time)  Medications Ordered in UC Medications - No data to display  Initial Impression / Assessment and Plan / UC Course  I have reviewed the triage vital signs and the nursing notes.  Pertinent labs & imaging results that were available during my care of the patient were reviewed by me and considered in my medical decision making (see chart for details).  Afebrile nontachycardic nontachypneic, oxygenating well on room air. No wheezes rhonchi or rales. Pt is covid positive, today is day 10 of symptoms Continue OTC medications; promethazine and tessalon as below  Final Clinical Impressions(s) / UC Diagnoses   Final diagnoses:  COVID-19     Discharge Instructions     -Promethazine DM cough syrup for congestion/cough. This could make you drowsy, so take at night before bed. -Tessalon as needed for cough. Take one pill up to 3x daily (every 8 hours) -For fevers/chills, body aches, headaches- use Tylenol and Ibuprofen. You can alternate these for maximum effect. Use up to 3000mg  Tylenol daily and 3200mg  Ibuprofen daily. Make sure to take ibuprofen with food. Check the bottle of ibuprofen/tylenol for specific dosage instructions. You can alternatively substitute Alleve for Ibuprofen.  -per CDC guidelines, you can return to work after 10 days of symptoms. I've provided a work note stating you can return to work on Thursday 06/13/2019.     ED Prescriptions    Medication Sig Dispense Auth. Provider   benzonatate (TESSALON) 100 MG capsule Take 1 capsule (100 mg total) by mouth every 8 (eight) hours. 21 capsule , PA-C   promethazine-dextromethorphan (PROMETHAZINE-DM) 6.25-15 MG/5ML syrup Take 5 mLs by mouth 4 (four) times daily as needed for cough. 118 mL Rhys Martini, PA-C     PDMP not reviewed this encounter.   07-19-1992, PA-C 06/10/20 1534

## 2020-06-10 NOTE — Discharge Instructions (Addendum)
-  Promethazine DM cough syrup for congestion/cough. This could make you drowsy, so take at night before bed. -Tessalon as needed for cough. Take one pill up to 3x daily (every 8 hours) -For fevers/chills, body aches, headaches- use Tylenol and Ibuprofen. You can alternate these for maximum effect. Use up to 3000mg  Tylenol daily and 3200mg  Ibuprofen daily. Make sure to take ibuprofen with food. Check the bottle of ibuprofen/tylenol for specific dosage instructions. You can alternatively substitute Alleve for Ibuprofen.  -per CDC guidelines, you can return to work after 10 days of symptoms. I've provided a work note stating you can return to work on Thursday 06/13/2019.

## 2020-06-11 ENCOUNTER — Other Ambulatory Visit: Payer: Self-pay

## 2020-06-11 ENCOUNTER — Emergency Department: Payer: Self-pay

## 2020-06-11 ENCOUNTER — Emergency Department
Admission: EM | Admit: 2020-06-11 | Discharge: 2020-06-11 | Disposition: A | Discharge status: Home / Self Care | Payer: Self-pay | Attending: Emergency Medicine | Admitting: Emergency Medicine

## 2020-06-11 ENCOUNTER — Encounter: Payer: Self-pay | Admitting: Emergency Medicine

## 2020-06-11 DIAGNOSIS — R0789 Other chest pain: Secondary | ICD-10-CM

## 2020-06-11 DIAGNOSIS — U071 COVID-19: Secondary | ICD-10-CM | POA: Insufficient documentation

## 2020-06-11 LAB — CBC
HCT: 33.7 % — ABNORMAL LOW (ref 36.0–46.0)
Hemoglobin: 10.2 g/dL — ABNORMAL LOW (ref 12.0–15.0)
MCH: 23 pg — ABNORMAL LOW (ref 26.0–34.0)
MCHC: 30.3 g/dL (ref 30.0–36.0)
MCV: 75.9 fL — ABNORMAL LOW (ref 80.0–100.0)
Platelets: 343 10*3/uL (ref 150–400)
RBC: 4.44 MIL/uL (ref 3.87–5.11)
RDW: 17.2 % — ABNORMAL HIGH (ref 11.5–15.5)
WBC: 4.9 10*3/uL (ref 4.0–10.5)
nRBC: 0 % (ref 0.0–0.2)

## 2020-06-11 LAB — BASIC METABOLIC PANEL
Anion gap: 11 (ref 5–15)
BUN: 12 mg/dL (ref 6–20)
CO2: 24 mmol/L (ref 22–32)
Calcium: 9 mg/dL (ref 8.9–10.3)
Chloride: 105 mmol/L (ref 98–111)
Creatinine, Ser: 0.8 mg/dL (ref 0.44–1.00)
GFR, Estimated: >60 mL/min (ref >60)
Glucose, Bld: 117 mg/dL — ABNORMAL HIGH (ref 70–99)
Potassium: 3.6 mmol/L (ref 3.5–5.1)
Sodium: 140 mmol/L (ref 135–145)

## 2020-06-11 LAB — TROPONIN I (HIGH SENSITIVITY)
Troponin I (High Sensitivity): 3 ng/L (ref <18)
Troponin I (High Sensitivity): 3 ng/L (ref <18)

## 2020-06-11 MED ORDER — ACETAMINOPHEN 500 MG PO TABS
1000.0000 mg | ORAL_TABLET | Freq: Once | ORAL | Status: AC
  Administered 2020-06-11: 1000 mg via ORAL
  Filled 2020-06-11: qty 2

## 2020-06-11 MED ORDER — IBUPROFEN 600 MG PO TABS
600.0000 mg | ORAL_TABLET | Freq: Once | ORAL | Status: AC
  Administered 2020-06-11: 600 mg via ORAL
  Filled 2020-06-11: qty 1

## 2020-06-11 MED ORDER — IOHEXOL 350 MG/ML SOLN
75.0000 mL | Freq: Once | INTRAVENOUS | Status: AC | PRN
  Administered 2020-06-11: 75 mL via INTRAVENOUS
  Filled 2020-06-11: qty 75

## 2020-06-11 NOTE — ED Triage Notes (Signed)
Pt to ED via EMS from home c/o central chest pain that started around 0215 this morning, nausea and vomiting x1.  Pt dx COVID positive 2 days ago.  Pt with strong dry cough.  A&Ox4, skin WNL.

## 2020-06-11 NOTE — ED Provider Notes (Signed)
[facility] Emergency Department Provider Note ____________________________________________   Event Date/Time   First MD Initiated Contact with Patient 06/11/20 [phone]     (approximate)  I have reviewed the triage vital signs and the nursing notes.  HISTORY  Chief Complaint Chest Pain   HPI Lisa Watson is a 35 y.o. femalewho presents to the ED for evaluation of chest pain.   Chart review indicates hx obesity and PCOS. Seen at urgent care yesterday for 2 days of URI symptoms. Reportedly Covid positive on 1/24.  Patient self-reports a liposuction procedure that was performed about 2 weeks ago.  Patient presents to the ED for evaluation of about 12 hours of constant chest pain.  She reports pain starting the afternoon of 1/25, substernal aching/burning that is constant in nature.  She reports a single episode of postprandial nausea and vomiting, but denies any abdominal pain or emesis otherwise.  She reports her breathing feels "okay."  Denies productive cough, syncopal episodes, trauma, rash.  She is not taken any medications prior to arrival.   Past Medical History:  Diagnosis Date  . PCOS (polycystic ovarian syndrome)     There are no problems to display for this patient.   History reviewed. No pertinent surgical history.  Prior to Admission medications   Medication Sig Start Date End Date Taking? Authorizing Provider  benzonatate (TESSALON) 100 MG capsule Take 1 capsule (100 mg total) by mouth every 8 (eight) hours. 06/10/20   Rhys Martini, PA-C  ibuprofen (ADVIL) 600 MG tablet Take 1 tablet (600 mg total) by mouth every 6 (six) hours as needed. 11/26/19   Darr, Gerilyn Pilgrim, PA-C  ondansetron (ZOFRAN) 4 MG tablet Take 1 tablet (4 mg total) by mouth every 6 (six) hours. As needed nausea/vomiting 11/26/19   Darr, Gerilyn Pilgrim, PA-C  promethazine-dextromethorphan (PROMETHAZINE-DM) 6.25-15 MG/5ML syrup Take 5 mLs by mouth 4 (four) times daily as needed for cough.  06/10/20   Rhys Martini, PA-C  SUMAtriptan (IMITREX) 50 MG tablet Take one at first sign of migraine.  Repeat in 2 hours if needed 11/26/19   Darr, Gerilyn Pilgrim, PA-C    Allergies Tramadol  Family History  Problem Relation Age of Onset  . Cataracts Mother   . Heart failure Mother   . Diabetes Father   . Hypertension Father     Social History Social History   Tobacco Use  . Smoking status: Never Smoker  . Smokeless tobacco: Never Used  Substance Use Topics  . Alcohol use: Not Currently    Comment: seldom  . Drug use: No    Review of Systems  Constitutional: No fever/chills Eyes: No visual changes. ENT: No sore throat. Cardiovascular: Positive for chest pain. Respiratory: Denies shortness of breath. Gastrointestinal: No abdominal pain.  No nausea, no vomiting.  No diarrhea.  No constipation. Genitourinary: Negative for dysuria. Musculoskeletal: Negative for back pain. Skin: Negative for rash. Neurological: Negative for headaches, focal weakness or numbness.  ____________________________________________   PHYSICAL EXAM:  VITAL SIGNS: Vitals:   06/11/20 0258 06/11/20 0515  BP: (!) 148/99 (!) 150/108  Pulse: 97 90  Resp: 18   Temp: 98.3 F (36.8 C)   SpO2: 99% 100%      Constitutional: Alert and oriented. Well appearing and in no acute distress. Eyes: Conjunctivae are normal. PERRL. EOMI. Head: Atraumatic. Nose: No congestion/rhinnorhea. Mouth/Throat: Mucous membranes are moist.  Oropharynx non-erythematous. Neck: No stridor. No cervical spine tenderness to palpation. Cardiovascular: Normal rate, regular rhythm. Grossly normal heart sounds.  Good peripheral circulation. Respiratory: Normal respiratory effort.  No retractions. Lungs CTAB. Gastrointestinal: Soft , nondistended, nontender to palpation. No CVA tenderness. Musculoskeletal: No lower extremity tenderness nor edema.  No joint effusions. No signs of acute trauma. Reproducible chest pain on palpation of  her left paramedian chest wall anteriorly around her third interspace Neurologic:  Normal speech and language. No gross focal neurologic deficits are appreciated. No gait instability noted. Skin:  Skin is warm, dry and intact. No rash noted. Psychiatric: Mood and affect are normal. Speech and behavior are normal.  ____________________________________________   LABS (all labs ordered are listed, but only abnormal results are displayed)  Labs Reviewed  BASIC METABOLIC PANEL - Abnormal; Notable for the following components:      Result Value   Glucose, Bld 117 (*)    All other components within normal limits  CBC - Abnormal; Notable for the following components:   Hemoglobin 10.2 (*)    HCT 33.7 (*)    MCV 75.9 (*)    MCH 23.0 (*)    RDW 17.2 (*)    All other components within normal limits  TROPONIN I (HIGH SENSITIVITY)  TROPONIN I (HIGH SENSITIVITY)   ____________________________________________  12 Lead EKG  Sinus rhythm, rate of 97 bpm.  Normal axis and intervals.  T wave inversion isolated to lead III, no further evidence of acute pathology. ____________________________________________  RADIOLOGY  ED MD interpretation: 2 view CXR reviewed by me with mild patchy multifocal infiltrates without discrete lobar filtration.  Official radiology report(s): DG Chest 2 View  Result Date: 06/11/2020 CLINICAL DATA:  Central chest pain, nausea, and vomiting. COVID positive 2 days ago. EXAM: CHEST - 2 VIEW COMPARISON:  10/23/2009 FINDINGS: Heart size is normal. Patchy infiltrate suggested in both lungs consistent with multifocal pneumonia. No pleural effusions. No pneumothorax. Mediastinal contours appear intact. IMPRESSION: Patchy bilateral pulmonary infiltrates consistent with multifocal pneumonia. Electronically Signed   By: Burman Nieves M.D.   On: 06/11/2020 03:10    ____________________________________________   PROCEDURES and INTERVENTIONS  Procedure(s) performed  (including Critical Care):  .1-3 Lead EKG Interpretation Performed by: Delton Prairie, MD Authorized by: Delton Prairie, MD     Interpretation: normal     ECG rate:  90   ECG rate assessment: normal     Rhythm: sinus rhythm     Ectopy: none     Conduction: normal      Medications  acetaminophen (TYLENOL) tablet 1,000 mg (1,000 mg Oral Given 06/11/20 0541)  ibuprofen (ADVIL) tablet 600 mg (600 mg Oral Given 06/11/20 0541)  iohexol (OMNIPAQUE) 350 MG/ML injection 75 mL (75 mLs Intravenous Contrast Given 06/11/20 0710)    ____________________________________________   MDM / ED COURSE   Otherwise healthy 35 year old woman presents to the ED with left-sided chest pain in the setting of COVID-19 and recent liposuction procedure, requiring CTA chest.  Normal vitals on room air.  Exam with reproducible chest pain upon palpation, otherwise she looks well without distress or significant tachypnea.  No neurovascular deficits.  Blood work with microcytic anemia, otherwise unremarkable.  2 negative high-sensitivity troponins.  EKG is nonischemic.  Due to persistent chest pain despite symptomatic measures, we will CTA chest to evaluate for PE considering her recent procedure and and acute COVID-19 infection.  Patient signed out to oncoming provider to follow-up on the CTA chest.  If negative, she would be suitable for outpatient management.  She would likely be suitable for outpatient management even if there is a small PE, as  long as there is no right heart strain, with the addition of anticoagulation to her regimen and close follow-up.  Clinical Course as of 06/11/20 0711  Wed Jun 11, 2020  0174 Reassessed.  Patient reports persistent chest pain.  We will place IV and CTA her chest to evaluate for acute PE. [DS]    Clinical Course User Index [DS] Delton Prairie, MD    ____________________________________________   FINAL CLINICAL IMPRESSION(S) / ED DIAGNOSES  Final diagnoses:  Other chest pain   COVID-19     ED Discharge Orders    None       Jaedin Trumbo Katrinka Blazing   Note:  This document was prepared using Dragon voice recognition software and may include unintentional dictation errors.   Delton Prairie, MD 06/11/20 [phone]

## 2020-06-17 ENCOUNTER — Telehealth (HOSPITAL_COMMUNITY): Payer: Self-pay | Admitting: Emergency Medicine

## 2020-06-17 NOTE — Telephone Encounter (Signed)
This RN received a phone call from this patient this morning stating she is concerned that her medicine is causing her chest pain and would like to speak to a provider.  This RN reviewed patient's chart, I do note patient was seen in ER for chest pain the day after her visit with [place].  This RN encouraged patient to follow up for medical evaluation if chest pain is new or different, and explained that we cannot assess her chest pain over the phone.  Patient verbalized understanding.

## 2020-09-22 ENCOUNTER — Other Ambulatory Visit: Payer: Self-pay

## 2020-09-22 ENCOUNTER — Emergency Department
Admission: EM | Admit: 2020-09-22 | Discharge: 2020-09-23 | Disposition: A | Discharge status: Home / Self Care | Payer: Self-pay | Attending: Emergency Medicine | Admitting: Emergency Medicine

## 2020-09-22 ENCOUNTER — Emergency Department: Payer: Self-pay

## 2020-09-22 DIAGNOSIS — R519 Headache, unspecified: Secondary | ICD-10-CM

## 2020-09-22 DIAGNOSIS — I10 Essential (primary) hypertension: Secondary | ICD-10-CM | POA: Insufficient documentation

## 2020-09-22 LAB — CBC WITH DIFFERENTIAL/PLATELET
Abs Immature Granulocytes: 0.04 10*3/uL (ref 0.00–0.07)
Basophils Absolute: 0 10*3/uL (ref 0.0–0.1)
Basophils Relative: 0 %
Eosinophils Absolute: 0.1 10*3/uL (ref 0.0–0.5)
Eosinophils Relative: 0 %
HCT: 35.3 % — ABNORMAL LOW (ref 36.0–46.0)
Hemoglobin: 10.7 g/dL — ABNORMAL LOW (ref 12.0–15.0)
Immature Granulocytes: 0 %
Lymphocytes Relative: 18 %
Lymphs Abs: 2.2 10*3/uL (ref 0.7–4.0)
MCH: 22.8 pg — ABNORMAL LOW (ref 26.0–34.0)
MCHC: 30.3 g/dL (ref 30.0–36.0)
MCV: 75.3 fL — ABNORMAL LOW (ref 80.0–100.0)
Monocytes Absolute: 0.9 10*3/uL (ref 0.1–1.0)
Monocytes Relative: 7 %
Neutro Abs: 9 10*3/uL — ABNORMAL HIGH (ref 1.7–7.7)
Neutrophils Relative %: 75 %
Platelets: 343 10*3/uL (ref 150–400)
RBC: 4.69 MIL/uL (ref 3.87–5.11)
RDW: 16.5 % — ABNORMAL HIGH (ref 11.5–15.5)
WBC: 12.2 10*3/uL — ABNORMAL HIGH (ref 4.0–10.5)
nRBC: 0 % (ref 0.0–0.2)

## 2020-09-22 MED ORDER — DIPHENHYDRAMINE HCL 50 MG/ML IJ SOLN
25.0000 mg | Freq: Once | INTRAMUSCULAR | Status: AC
  Administered 2020-09-22: 25 mg via INTRAVENOUS
  Filled 2020-09-22: qty 1

## 2020-09-22 MED ORDER — PROCHLORPERAZINE EDISYLATE 10 MG/2ML IJ SOLN
5.0000 mg | Freq: Once | INTRAMUSCULAR | Status: AC
  Administered 2020-09-22: 5 mg via INTRAVENOUS
  Filled 2020-09-22: qty 2

## 2020-09-22 NOTE — ED Triage Notes (Signed)
Pt to triage via wheelchair.  Pt reports a headaches since last week.  Pt has nausea. Pt taking pain meds without relief.  Pt alert  Speech clear.

## 2020-09-22 NOTE — ED Provider Notes (Signed)
[facility] Emergency Department Provider Note   ____________________________________________   Event Date/Time   First MD Initiated Contact with Patient 09/22/20 2303     (approximate)  I have reviewed the triage vital signs and the nursing notes.   HISTORY  Chief Complaint Headache    HPI Xiao Graul is a 35 y.o. female who presents to the ED from home with a chief complaint of headache.  Patient reports history of migraine headaches for which she takes Excedrin at home.  Has been having headaches times several days which coincides with several work/life stressors.  Saw her PCP yesterday and started on amlodipine for hypertension.  Reports gradual onset, frontal headache associated with nausea.  Denies slurred speech, confusion, extremity weakness, facial droop.  Denies vision changes, neck pain, chest pain, shortness of breath, abdominal pain, vomiting or dizziness.     Past Medical History:  Diagnosis Date  . PCOS (polycystic ovarian syndrome)     There are no problems to display for this patient.   No past surgical history on file.  Prior to Admission medications   Medication Sig Start Date End Date Taking? Authorizing Provider  butalbital-acetaminophen-caffeine (FIORICET) 50-325-40 MG tablet Take 1 tablet by mouth every 6 (six) hours as needed for headache. 09/23/20  Yes Irean Hong, MD  benzonatate (TESSALON) 100 MG capsule Take 1 capsule (100 mg total) by mouth every 8 (eight) hours. 06/10/20   Rhys Martini, PA-C  ibuprofen (ADVIL) 600 MG tablet Take 1 tablet (600 mg total) by mouth every 6 (six) hours as needed. 11/26/19   Darr, Gerilyn Pilgrim, PA-C  ondansetron (ZOFRAN) 4 MG tablet Take 1 tablet (4 mg total) by mouth every 6 (six) hours. As needed nausea/vomiting 11/26/19   Darr, Gerilyn Pilgrim, PA-C  promethazine-dextromethorphan (PROMETHAZINE-DM) 6.25-15 MG/5ML syrup Take 5 mLs by mouth 4 (four) times daily as needed for cough. 06/10/20   Rhys Martini, PA-C  SUMAtriptan (IMITREX) 50 MG tablet Take one at first sign of migraine.  Repeat in 2 hours if needed 11/26/19   Darr, Gerilyn Pilgrim, PA-C    Allergies Tramadol  Family History  Problem Relation Age of Onset  . Cataracts Mother   . Heart failure Mother   . Diabetes Father   . Hypertension Father     Social History Social History   Tobacco Use  . Smoking status: Never Smoker  . Smokeless tobacco: Never Used  Substance Use Topics  . Alcohol use: Not Currently    Comment: seldom  . Drug use: No    Review of Systems  Constitutional: No fever/chills Eyes: No visual changes. ENT: No sore throat. Cardiovascular: Denies chest pain. Respiratory: Denies shortness of breath. Gastrointestinal: No abdominal pain.  No nausea, no vomiting.  No diarrhea.  No constipation. Genitourinary: Negative for dysuria. Musculoskeletal: Negative for back pain. Skin: Negative for rash. Neurological: Positive for headache. Negative for focal weakness or numbness.   ____________________________________________   PHYSICAL EXAM:  VITAL SIGNS: ED Triage Vitals  Enc Vitals Group     BP 09/22/20 2217 (!) 166/108     Pulse Rate 09/22/20 2217 79     Resp 09/22/20 2217 20     Temp 09/22/20 2217 97.9 F (36.6 C)     Temp Source 09/22/20 2217 Oral     SpO2 09/22/20 2217 98 %     Weight 09/22/20 2218 250 lb (113.4 kg)     Height 09/22/20 2218 5\' 6"  (1.676 m)     Head Circumference --  Peak Flow --      Pain Score 09/22/20 2218 10     Pain Loc --      Pain Edu? --      Excl. in GC? --     Constitutional: Alert and oriented.  Uncomfortable appearing and in mild acute distress.  Hyperventilating. Eyes: Conjunctivae are normal. PERRL. EOMI. Head: Atraumatic. Nose: No congestion/rhinnorhea. Mouth/Throat: Mucous membranes are moist.   Neck: No stridor.  Supple neck without meningismus.  No carotid bruits. Cardiovascular: Normal rate, regular rhythm. Grossly normal heart sounds.  Good  peripheral circulation. Respiratory: Normal respiratory effort.  No retractions. Lungs CTAB. Gastrointestinal: Soft and nontender. No distention. No abdominal bruits. No CVA tenderness. Musculoskeletal: No lower extremity tenderness nor edema.  No joint effusions. Neurologic: Alert and oriented x3.  CN II to XII grossly intact.  Whispering speech and language. No gross focal neurologic deficits are appreciated.  Skin:  Skin is warm, dry and intact. No rash noted. Psychiatric: Mood and affect are anxious. Speech and behavior are normal.  ____________________________________________   LABS (all labs ordered are listed, but only abnormal results are displayed)  Labs Reviewed  CBC WITH DIFFERENTIAL/PLATELET - Abnormal; Notable for the following components:      Result Value   WBC 12.2 (*)    Hemoglobin 10.7 (*)    HCT 35.3 (*)    MCV 75.3 (*)    MCH 22.8 (*)    RDW 16.5 (*)    Neutro Abs 9.0 (*)    All other components within normal limits  COMPREHENSIVE METABOLIC PANEL - Abnormal; Notable for the following components:   Glucose, Bld 109 (*)    All other components within normal limits  POC URINE PREG, ED  TROPONIN I (HIGH SENSITIVITY)   ____________________________________________  EKG  ED ECG REPORT I, Sajid Ruppert J, the attending physician, personally viewed and interpreted this ECG.   Date: 09/23/2020  EKG Time: 0128  Rate: 93  Rhythm: normal EKG, normal sinus rhythm  Axis: Normal  Intervals:none  ST&T Change: Nonspecific  ____________________________________________  RADIOLOGY I, Lamone Ferrelli J, personally viewed and evaluated these images (plain radiographs) as part of my medical decision making, as well as reviewing the written report by the radiologist.  ED MD interpretation: No ICH  Official radiology report(s): CT Head Wo Contrast  Result Date: 09/23/2020 CLINICAL DATA:  35 year old female with headache. EXAM: CT HEAD WITHOUT CONTRAST TECHNIQUE: Contiguous  axial images were obtained from the base of the skull through the vertex without intravenous contrast. COMPARISON:  None. FINDINGS: Brain: The ventricles and sulci appropriate size for patient's age. The gray-white matter discrimination is preserved. There is no acute intracranial hemorrhage. No mass effect or midline shift. No extra-axial fluid collection. Vascular: No hyperdense vessel or unexpected calcification. Skull: Normal. Negative for fracture or focal lesion. Sinuses/Orbits: No acute finding. Other: None IMPRESSION: Unremarkable noncontrast CT of the brain. Electronically Signed   By: Elgie Collard M.D.   On: 09/23/2020 00:28    ____________________________________________   PROCEDURES  Procedure(s) performed (including Critical Care):  Procedures   ____________________________________________   INITIAL IMPRESSION / ASSESSMENT AND PLAN / ED COURSE  As part of my medical decision making, I reviewed the following data within the electronic MEDICAL RECORD NUMBER History obtained from family, Nursing notes reviewed and incorporated, Labs reviewed, EKG interpreted, Old chart reviewed, Radiograph reviewed and Notes from prior ED visits     35 year old female presenting with headache, increased blood pressure and anxiety Differential diagnosis includes, but is  not limited to, intracranial hemorrhage, meningitis/encephalitis, previous head trauma, cavernous venous thrombosis, tension headache, temporal arteritis, migraine or migraine equivalent, idiopathic intracranial hypertension, and non-specific headache.  Will obtain basic lab work, CT head.  Administer IV Compazine.  With Benadryl.  Will reassess.  Clinical Course as of 09/23/20 0152  Tue Sep 23, 2020  0004 Patient whimpering, moaning after Compazine.  Will administer IV Ativan. [JS]  [id number] Patient soundly asleep in no acute distress.  Updated mother of all test results.  Will continue to monitor. [JS]  0149 Awake, feeling  significantly better.  No focal neurological deficits on reexamination.  Neck remains supple without meningismus.  Updated patient of all test results.  Will discharge home with Fioricet to use as needed.  Strict return precautions given.  Patient and mother verbalized understanding agree with plan of care. [JS]    Clinical Course User Index [JS] Irean Hong, MD     ____________________________________________   FINAL CLINICAL IMPRESSION(S) / ED DIAGNOSES  Final diagnoses:  Hypertension, unspecified type  Acute nonintractable headache, unspecified headache type     ED Discharge Orders         Ordered    butalbital-acetaminophen-caffeine (FIORICET) 50-325-40 MG tablet  Every 6 hours PRN        09/23/20 0150          *Please note:  Natassia Guthridge was evaluated in Emergency Department on 09/23/2020 for the symptoms described in the history of present illness. She was evaluated in the context of the global COVID-19 pandemic, which necessitated consideration that the patient might be at risk for infection with the SARS-CoV-2 virus that causes COVID-19. Institutional protocols and algorithms that pertain to the evaluation of patients at risk for COVID-19 are in a state of rapid change based on information released by regulatory bodies including the CDC and federal and state organizations. These policies and algorithms were followed during the patient's care in the ED.  Some ED evaluations and interventions may be delayed as a result of limited staffing during and the pandemic.*   Note:  This document was prepared using Dragon voice recognition software and may include unintentional dictation errors.   Irean Hong, MD 09/23/20 [phone]

## 2020-09-23 LAB — COMPREHENSIVE METABOLIC PANEL
ALT: 23 U/L (ref 0–44)
AST: 22 U/L (ref 15–41)
Albumin: 4.4 g/dL (ref 3.5–5.0)
Alkaline Phosphatase: 53 U/L (ref 38–126)
Anion gap: 9 (ref 5–15)
BUN: 14 mg/dL (ref 6–20)
CO2: 25 mmol/L (ref 22–32)
Calcium: 9.4 mg/dL (ref 8.9–10.3)
Chloride: 103 mmol/L (ref 98–111)
Creatinine, Ser: 0.9 mg/dL (ref 0.44–1.00)
GFR, Estimated: >60 mL/min (ref >60)
Glucose, Bld: 109 mg/dL — ABNORMAL HIGH (ref 70–99)
Potassium: 3.9 mmol/L (ref 3.5–5.1)
Sodium: 137 mmol/L (ref 135–145)
Total Bilirubin: 0.4 mg/dL (ref 0.3–1.2)
Total Protein: 8 g/dL (ref 6.5–8.1)

## 2020-09-23 LAB — POC URINE PREG, ED: Preg Test, Ur: NEGATIVE

## 2020-09-23 LAB — TROPONIN I (HIGH SENSITIVITY): Troponin I (High Sensitivity): 2 ng/L (ref <18)

## 2020-09-23 MED ORDER — LORAZEPAM 2 MG/ML IJ SOLN
1.0000 mg | Freq: Once | INTRAMUSCULAR | Status: AC
  Administered 2020-09-23: 1 mg via INTRAVENOUS
  Filled 2020-09-23: qty 1

## 2020-09-23 MED ORDER — BUTALBITAL-APAP-CAFFEINE 50-325-40 MG PO TABS: 1.0000 | ORAL_TABLET | Freq: Four times a day (QID) | ORAL | 0 refills | Status: DC | PRN

## 2020-09-23 NOTE — Discharge Instructions (Signed)
Continue to take your blood pressure medicine as recently prescribed.  You may take Fioricet as needed for headache.  Return to the ER for worsening symptoms, persistent vomiting, difficulty breathing or other concerns.

## 2021-01-17 ENCOUNTER — Ambulatory Visit (HOSPITAL_COMMUNITY)
Admission: EM | Admit: 2021-01-17 | Discharge: 2021-01-17 | Disposition: A | Discharge status: Home / Self Care | Payer: Self-pay | Attending: Physician Assistant | Admitting: Physician Assistant

## 2021-01-17 ENCOUNTER — Encounter (HOSPITAL_COMMUNITY): Payer: Self-pay

## 2021-01-17 ENCOUNTER — Other Ambulatory Visit: Payer: Self-pay

## 2021-01-17 DIAGNOSIS — Z1152 Encounter for screening for COVID-19: Secondary | ICD-10-CM

## 2021-01-17 DIAGNOSIS — J069 Acute upper respiratory infection, unspecified: Secondary | ICD-10-CM

## 2021-01-17 NOTE — Discharge Instructions (Addendum)
Await COVID results.  We will call if positive.  Recommend over-the-counter symptomatic treatment, increase fluids, and rest.  Recommend establishing care with a primary care provider for further evaluation of elevated blood pressure.  Encouraged sooner follow-up with any worsening symptoms, or if symptoms fail to improve.

## 2021-01-17 NOTE — ED Provider Notes (Signed)
[facility]    CSN: [id number] Arrival date & time: 01/17/21  1457      History   Chief Complaint Chief Complaint  Patient presents with   URI    HPI Lisa Watson is a 35 y.o. female.   Patient here today for evaluation of sinus congestion, sore throat, and cough that has been present for the last few days.  She denies any abdominal pain, nausea, vomiting, or diarrhea.  She denies any known sick contacts.  She has tried multiple over-the-counter medications including DayQuil without resolution of symptoms.  She did test positive for COVID in January 2022 but has not had COVID testing since current symptoms started.  The history is provided by the patient.  URI Presenting symptoms: congestion, cough and sore throat   Presenting symptoms: no fever    Past Medical History:  Diagnosis Date   PCOS (polycystic ovarian syndrome)     There are no problems to display for this patient.   History reviewed. No pertinent surgical history.  OB History   No obstetric history on file.      Home Medications    Prior to Admission medications   Medication Sig Start Date End Date Taking? Authorizing Provider  benzonatate (TESSALON) 100 MG capsule Take 1 capsule (100 mg total) by mouth every 8 (eight) hours. 06/10/20   Rhys Martini, PA-C  butalbital-acetaminophen-caffeine (FIORICET) [phone] MG tablet Take 1 tablet by mouth every 6 (six) hours as needed for headache. 09/23/20   Irean Hong, MD  ibuprofen (ADVIL) 600 MG tablet Take 1 tablet (600 mg total) by mouth every 6 (six) hours as needed. 11/26/19   Darr, Gerilyn Pilgrim, PA-C  ondansetron (ZOFRAN) 4 MG tablet Take 1 tablet (4 mg total) by mouth every 6 (six) hours. As needed nausea/vomiting 11/26/19   Darr, Gerilyn Pilgrim, PA-C  promethazine-dextromethorphan (PROMETHAZINE-DM) 6.25-15 MG/5ML syrup Take 5 mLs by mouth 4 (four) times daily as needed for cough. 06/10/20   Rhys Martini, PA-C  SUMAtriptan (IMITREX) 50 MG tablet Take one  at first sign of migraine.  Repeat in 2 hours if needed 11/26/19   Darr, Gerilyn Pilgrim, PA-C    Family History Family History  Problem Relation Age of Onset   Cataracts Mother    Heart failure Mother    Diabetes Father    Hypertension Father     Social History Social History   Tobacco Use   Smoking status: Never   Smokeless tobacco: Never  Substance Use Topics   Alcohol use: Not Currently    Comment: seldom   Drug use: No     Allergies   Tramadol   Review of Systems Review of Systems  Constitutional:  Negative for chills and fever.  HENT:  Positive for congestion and sore throat.   Respiratory:  Positive for cough. Negative for shortness of breath.   Cardiovascular:  Negative for chest pain.  Gastrointestinal:  Negative for abdominal pain, diarrhea, nausea and vomiting.    Physical Exam Triage Vital Signs ED Triage Vitals  Enc Vitals Group     BP 01/17/21 1531 (!) 183/134     Pulse Rate 01/17/21 1531 87     Resp 01/17/21 1531 20     Temp 01/17/21 1531 99 F (37.2 C)     Temp Source 01/17/21 1531 Oral     SpO2 01/17/21 1531 93 %     Weight --      Height --      Head Circumference --  Peak Flow --      Pain Score 01/17/21 1532 6     Pain Loc --      Pain Edu? --      Excl. in [place]? --    No data found.  Updated Vital Signs BP (!) 183/134 (BP Location: Left Arm) Comment: coughing fits  Pulse 87   Temp 99 F (37.2 C) (Oral)   Resp 20   LMP 12/28/2020   SpO2 93%   Vitals on recheck:: BP: 143/103  SpO2: 97%    Physical Exam Vitals and nursing note reviewed.  Constitutional:      General: She is not in acute distress.    Appearance: Normal appearance. She is not ill-appearing.  HENT:     Head: Normocephalic and atraumatic.     Nose: Congestion present.     Mouth/Throat:     Pharynx: No oropharyngeal exudate or posterior oropharyngeal erythema.  Eyes:     Conjunctiva/sclera: Conjunctivae normal.  Cardiovascular:     Rate and Rhythm: Normal rate  and regular rhythm.     Heart sounds: Normal heart sounds.  Pulmonary:     Effort: Pulmonary effort is normal. No respiratory distress.     Breath sounds: Normal breath sounds. No wheezing, rhonchi or rales.  Neurological:     Mental Status: She is alert.  Psychiatric:        Mood and Affect: Mood normal.        Thought Content: Thought content normal.     UC Treatments / Results  Labs (all labs ordered are listed, but only abnormal results are displayed) Labs Reviewed  SARS CORONAVIRUS 2 (TAT 6-24 HRS)    EKG   Radiology No results found.  Procedures Procedures (including critical care time)  Medications Ordered in UC Medications - No data to display  Initial Impression / Assessment and Plan / UC Course  I have reviewed the triage vital signs and the nursing notes.  Pertinent labs & imaging results that were available during my care of the patient were reviewed by me and considered in my medical decision making (see chart for details).  Discussed that symptoms seem consistent with viral upper respiratory infection.  Will screen for COVID to rule out etiology.  Recommended continued over-the-counter symptomatic treatment, increase fluids, and rest.  Discussed elevated blood pressure in office, patient reports she does not take any medication for blood pressure but on historical review it looks like she was seen in the ED several months ago for elevated blood pressure as well.  She did have headache at this time.  Recommended further evaluation by primary care provider, patient reports she does not have a PCP at this time and encouraged her to establish care.  Encouraged follow-up here with any further concerns or questions.  Final Clinical Impressions(s) / UC Diagnoses   Final diagnoses:  Viral upper respiratory tract infection  Encounter for screening for COVID-19     Discharge Instructions      Await COVID results.  We will call if positive.  Recommend  over-the-counter symptomatic treatment, increase fluids, and rest.  Recommend establishing care with a primary care provider for further evaluation of elevated blood pressure.  Encouraged sooner follow-up with any worsening symptoms, or if symptoms fail to improve.     ED Prescriptions   None    PDMP not reviewed this encounter.   Tomi Bamberger, PA-C 01/17/21 1554

## 2021-01-17 NOTE — ED Triage Notes (Signed)
Pt presents with non productive cough, sore throat, chills, and nasal drainage X 3 days.

## 2021-01-18 LAB — SARS CORONAVIRUS 2 (TAT 6-24 HRS): SARS Coronavirus 2: POSITIVE — AB

## 2021-06-06 ENCOUNTER — Ambulatory Visit
Admission: EM | Admit: 2021-06-06 | Discharge: 2021-06-06 | Disposition: A | Discharge status: Home / Self Care | Payer: Self-pay | Attending: Family Medicine | Admitting: Family Medicine

## 2021-06-06 ENCOUNTER — Other Ambulatory Visit: Payer: Self-pay

## 2021-06-06 ENCOUNTER — Encounter: Payer: Self-pay | Admitting: Emergency Medicine

## 2021-06-06 DIAGNOSIS — M5412 Radiculopathy, cervical region: Secondary | ICD-10-CM

## 2021-06-06 MED ORDER — NAPROXEN 500 MG PO TABS: 500.0000 mg | ORAL_TABLET | Freq: Two times a day (BID) | ORAL | 0 refills | Status: DC

## 2021-06-06 MED ORDER — DEXAMETHASONE SODIUM PHOSPHATE 10 MG/ML IJ SOLN
10.0000 mg | Freq: Once | INTRAMUSCULAR | Status: AC
  Administered 2021-06-06: 10 mg via INTRAMUSCULAR

## 2021-06-06 MED ORDER — TIZANIDINE HCL 4 MG PO TABS: 4.0000 mg | ORAL_TABLET | Freq: Three times a day (TID) | ORAL | 0 refills | Status: DC | PRN

## 2021-06-06 MED ORDER — KETOROLAC TROMETHAMINE 30 MG/ML IJ SOLN
30.0000 mg | Freq: Once | INTRAMUSCULAR | Status: AC
  Administered 2021-06-06: 30 mg via INTRAMUSCULAR

## 2021-06-06 NOTE — ED Provider Notes (Signed)
Lisa Watson    CSN: [id number] Arrival date & time: 06/06/21  0944      History   Chief Complaint Chief Complaint  Patient presents with   Neck Pain    HPI Lisa Watson is a 36 y.o. female.   HPI  Patient presents today with acute onset left shoulder and mid-left sided cervical neck pain without injury since last night. She is server and endorses heavy lifting. No prior history of  of an injury causing neck or shoulder pain. The pain is located at the base of neck and radiates into the left scapula region. She took 2 Aleve last night and awaken with neck stiffness and pain and continues to have left scapula pain. There is not numbness, tingling, or weakness present.  Past Medical History:  Diagnosis Date   PCOS (polycystic ovarian syndrome)     There are no problems to display for this patient.   History reviewed. No pertinent surgical history.  OB History   No obstetric history on file.      Home Medications    Prior to Admission medications   Medication Sig Start Date End Date Taking? Authorizing Provider  naproxen (NAPROSYN) 500 MG tablet Take 1 tablet (500 mg total) by mouth 2 (two) times daily. 06/06/21  Yes Scot Jun, FNP  tiZANidine (ZANAFLEX) 4 MG tablet Take 1 tablet (4 mg total) by mouth every 8 (eight) hours as needed for muscle spasms. 06/06/21  Yes Scot Jun, FNP  benzonatate (TESSALON) 100 MG capsule Take 1 capsule (100 mg total) by mouth every 8 (eight) hours. 06/10/20   Hazel Sams, PA-C  butalbital-acetaminophen-caffeine (FIORICET) [phone] MG tablet Take 1 tablet by mouth every 6 (six) hours as needed for headache. 09/23/20   Paulette Blanch, MD  ibuprofen (ADVIL) 600 MG tablet Take 1 tablet (600 mg total) by mouth every 6 (six) hours as needed. 11/26/19   Darr, Edison Nasuti, PA-C  ondansetron (ZOFRAN) 4 MG tablet Take 1 tablet (4 mg total) by mouth every 6 (six) hours. As needed nausea/vomiting 11/26/19   Darr, Edison Nasuti, PA-C   promethazine-dextromethorphan (PROMETHAZINE-DM) 6.25-15 MG/5ML syrup Take 5 mLs by mouth 4 (four) times daily as needed for cough. 06/10/20   Hazel Sams, PA-C  SUMAtriptan (IMITREX) 50 MG tablet Take one at first sign of migraine.  Repeat in 2 hours if needed 11/26/19   Darr, Edison Nasuti, PA-C    Family History Family History  Problem Relation Age of Onset   Cataracts Mother    Heart failure Mother    Diabetes Father    Hypertension Father     Social History Social History   Tobacco Use   Smoking status: Never   Smokeless tobacco: Never  Vaping Use   Vaping Use: Never used  Substance Use Topics   Alcohol use: Not Currently    Comment: seldom   Drug use: No     Allergies   Tramadol   Review of Systems Review of Systems Pertinent negatives listed in HPI   Physical Exam Triage Vital Signs ED Triage Vitals  Enc Vitals Group     BP 06/06/21 0958 (!) 162/103     Pulse Rate 06/06/21 0958 66     Resp 06/06/21 0958 18     Temp 06/06/21 0958 98.1 F (36.7 C)     Temp Source 06/06/21 0958 Oral     SpO2 06/06/21 0958 98 %     Weight --      Height --  Head Circumference --      Peak Flow --      Pain Score 06/06/21 1003 8     Pain Loc --      Pain Edu? --      Excl. in [place]? --    No data found.  Updated Vital Signs BP (!) 162/103 (BP Location: Left Arm)    Pulse 66    Temp 98.1 F (36.7 C) (Oral)    Resp 18    LMP 05/23/2021 (Approximate)    SpO2 98%   Visual Acuity Right Eye Distance:   Left Eye Distance:   Bilateral Distance:    Right Eye Near:   Left Eye Near:    Bilateral Near:     Physical Exam Constitutional:      General: She is not in acute distress.    Appearance: She is obese. She is not ill-appearing.  HENT:     Head: Normocephalic and atraumatic.  Cardiovascular:     Rate and Rhythm: Normal rate and regular rhythm.     Heart sounds: Normal heart sounds.  Pulmonary:     Effort: Pulmonary effort is normal.     Breath sounds: Normal  breath sounds and air entry.  Musculoskeletal:       Arms:     Cervical back: Tenderness present. No edema, erythema or signs of trauma. Pain with movement and spinous process tenderness present. Decreased range of motion.  Lymphadenopathy:     Cervical: No cervical adenopathy.  Skin:    General: Skin is warm.     Capillary Refill: Capillary refill takes less than 2 seconds.  Neurological:     General: No focal deficit present.     Mental Status: She is alert and oriented to person, place, and time.     GCS: GCS eye subscore is 4. GCS verbal subscore is 5. GCS motor subscore is 6.     Sensory: Sensation is intact.     Motor: Motor function is intact.     Coordination: Coordination is intact.     Gait: Gait is intact.     UC Treatments / Results  Labs (all labs ordered are listed, but only abnormal results are displayed) Labs Reviewed - No data to display  EKG   Radiology No results found.  Procedures Procedures (including critical care time)  Medications Ordered in UC Medications  ketorolac (TORADOL) 30 MG/ML injection 30 mg (30 mg Intramuscular Given 06/06/21 1051)  dexamethasone (DECADRON) injection 10 mg (10 mg Intramuscular Given 06/06/21 1051)    Initial Impression / Assessment and Plan / UC Course  I have reviewed the triage vital signs and the nursing notes.  Pertinent labs & imaging results that were available during my care of the patient were reviewed by me and considered in my medical decision making (see chart for details).    Cervical radiculopathy Tx with naproxen and Tizanidine Hold Naproxen x 1 day as patient treated with Tordaol and Decadron IM here in clinic. Apply heat and ROM exercises as tolerated. Follow-up with no improvement.  Final diagnoses:  Cervical radiculopathy     Discharge Instructions      Start Naproxen tomorrow if needed for pain and inflammation. Take Tizanidine PRN, caution as muscle relaxer can cause  drowsiness.     ED Prescriptions     Medication Sig Dispense Auth. Provider   naproxen (NAPROSYN) 500 MG tablet Take 1 tablet (500 mg total) by mouth 2 (two) times daily. 20 tablet Molli Barrows  S, FNP   tiZANidine (ZANAFLEX) 4 MG tablet Take 1 tablet (4 mg total) by mouth every 8 (eight) hours as needed for muscle spasms. 30 tablet Scot Jun, FNP      PDMP not reviewed this encounter.   Scot Jun, [place] 06/08/21 [phone]

## 2021-06-06 NOTE — Discharge Instructions (Addendum)
Start Naproxen tomorrow if needed for pain and inflammation. Take Tizanidine PRN, caution as muscle relaxer can cause drowsiness.

## 2021-06-06 NOTE — ED Triage Notes (Signed)
Pt states she woke up yesterday with her left shoulder hurting she took some aleve and when she woke up this morning her neck was hurting.

## 2021-06-09 ENCOUNTER — Telehealth: Payer: Self-pay | Admitting: Family Medicine

## 2021-06-10 ENCOUNTER — Other Ambulatory Visit: Payer: Self-pay

## 2021-06-10 ENCOUNTER — Ambulatory Visit
Admission: EM | Admit: 2021-06-10 | Discharge: 2021-06-10 | Disposition: A | Discharge status: Home / Self Care | Payer: Self-pay | Attending: Urgent Care | Admitting: Urgent Care

## 2021-06-10 ENCOUNTER — Ambulatory Visit (INDEPENDENT_AMBULATORY_CARE_PROVIDER_SITE_OTHER): Payer: Self-pay

## 2021-06-10 ENCOUNTER — Encounter: Payer: Self-pay | Admitting: Emergency Medicine

## 2021-06-10 DIAGNOSIS — M5136 Other intervertebral disc degeneration, lumbar region: Secondary | ICD-10-CM

## 2021-06-10 DIAGNOSIS — M549 Dorsalgia, unspecified: Secondary | ICD-10-CM

## 2021-06-10 DIAGNOSIS — M545 Low back pain, unspecified: Secondary | ICD-10-CM

## 2021-06-10 MED ORDER — PREDNISONE 50 MG PO TABS: 50.0000 mg | ORAL_TABLET | Freq: Every day | ORAL | 0 refills | Status: DC

## 2021-06-10 NOTE — Discharge Instructions (Signed)
[facility] service [address]  [phone]
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
## 2021-06-10 NOTE — ED Triage Notes (Signed)
Pt here with left thoracic back pain that shooting down to her lumbar region and up to her left arm periodically. States the medication works, but then it comes back. Pt requests a note for work to tell them what her lifting restrictions are.

## 2021-08-13 ENCOUNTER — Ambulatory Visit: Payer: Self-pay

## 2021-08-20 ENCOUNTER — Encounter: Payer: Self-pay | Admitting: Family Medicine

## 2021-08-20 ENCOUNTER — Ambulatory Visit (LOCAL_COMMUNITY_HEALTH_CENTER): Payer: Self-pay | Admitting: Family Medicine

## 2021-08-20 VITALS — BP 175/107 | Ht 67.6 in | Wt 251.4 lb

## 2021-08-20 DIAGNOSIS — Z113 Encounter for screening for infections with a predominantly sexual mode of transmission: Secondary | ICD-10-CM

## 2021-08-20 DIAGNOSIS — Z3009 Encounter for other general counseling and advice on contraception: Secondary | ICD-10-CM

## 2021-08-20 DIAGNOSIS — Z1272 Encounter for screening for malignant neoplasm of vagina: Secondary | ICD-10-CM

## 2021-08-20 DIAGNOSIS — F32A Depression, unspecified: Secondary | ICD-10-CM

## 2021-08-20 DIAGNOSIS — Z01419 Encounter for gynecological examination (general) (routine) without abnormal findings: Secondary | ICD-10-CM

## 2021-08-20 LAB — HM HIV SCREENING LAB: HM HIV Screening: NEGATIVE

## 2021-08-20 LAB — WET PREP FOR TRICH, YEAST, CLUE
Trichomonas Exam: NEGATIVE
Yeast Exam: NEGATIVE

## 2021-08-20 LAB — HEMOGLOBIN, FINGERSTICK: Hemoglobin: 11.4 g/dL (ref 11.1–15.9)

## 2021-08-20 NOTE — Progress Notes (Signed)
Pt here for PE.  Wet mount results reviewed, no treatment required per SO.  Pt notified of normal Hgb results.  Pt declined birth control.  Berdie Ogren, RN ? ?

## 2021-08-23 NOTE — Progress Notes (Signed)
[facility] DEPARTMENT ?[facility] ?[address] [employer] ?Main Number: [phone] ? ? ? ?Family Planning Visit- Initial Visit ? ?Subjective:  ?Lisa Watson is a 36 y.o.  [id number]   being seen today for an initial annual visit and to discuss reproductive life planning.  The patient is currently using No Method - No Contraceptive Precautions for pregnancy prevention. Patient reports   does not want a pregnancy in the next year.   ? ? report they are  not looking for a method.  ? ?Patient has the following medical conditions does not have a problem list on file. ? ?Chief Complaint  ?Patient presents with  ? Annual Exam  ?  PE  ? ? ?Patient reports here for PE.   ? ?Patient denies PE or pap in 12 years   ? ?Body mass index is 38.68 kg/m?. - Patient is eligible for diabetes screening based on BMI and age >25?  not applicable ?HA1C ordered? no ? ?Patient reports 1  partner/s in last year. Desires STI screening?  Yes ? ?Has patient been screened once for HCV in the past?  No ? No results found for: HCVAB ? ?Does the patient have current drug use (including MJ), have a partner with drug use, and/or has been incarcerated since last result? No  ?If yes-- Screen for HCV through [facility] ?  ?Does the patient meet criteria for HBV testing? No ? ?Criteria:  ?-Household, sexual or needle sharing contact with HBV ?-History of drug use ?-HIV positive ?-Those with known Hep C ? ? ?Health Maintenance Due  ?Topic Date Due  ? COVID-19 Vaccine (1) Never done  ? HIV Screening  Never done  ? Hepatitis C Screening  Never done  ? PAP SMEAR-Modifier  10/28/2012  ? ? ?Review of Systems  ?Constitutional:  Positive for weight loss. Negative for chills, fever and malaise/fatigue.  ?     3 lbs in 2 months   ?HENT:  Negative for congestion, hearing loss and sore throat.   ?Eyes:  Negative for blurred vision, double vision and photophobia.  ?Respiratory:  Negative for shortness of breath.   ?Cardiovascular:   Negative for chest pain.  ?Gastrointestinal:  Negative for abdominal pain, blood in stool, constipation, diarrhea, heartburn, nausea and vomiting.  ?Genitourinary:  Negative for dysuria and frequency.  ?Musculoskeletal:  Negative for back pain, joint pain and neck pain.  ?Skin:  Negative for itching and rash.  ?Neurological:  Positive for headaches. Negative for dizziness and weakness.  ?Endo/Heme/Allergies:  Bruises/bleeds easily.  ?Psychiatric/Behavioral:  Negative for depression, substance abuse and suicidal ideas.   ? ?The following portions of the patient's history were reviewed and updated as appropriate: allergies, current medications, past family history, past medical history, past social history, past surgical history and problem list. Problem list updated. ? ? ?See flowsheet for other program required questions. ? ?Objective:  ? ?Vitals:  ? 08/20/21 1044 08/20/21 1102  ?BP: (!) 170/108 (!) 175/107  ?Weight: 251 lb 6.4 oz (114 kg)   ?Height: 5' 7.6" (1.717 m)   ? ? ?Physical Exam ?Vitals and nursing note reviewed.  ?Constitutional:   ?   Appearance: Normal appearance. She is obese.  ?HENT:  ?   Head: Normocephalic and atraumatic.  ?   Mouth/Throat:  ?   Mouth: Mucous membranes are moist.  ?   Pharynx: No oropharyngeal exudate or posterior oropharyngeal erythema.  ?Eyes:  ?   General: No scleral icterus. ?Cardiovascular:  ?  Rate and Rhythm: Normal rate and regular rhythm.  ?   Pulses: Normal pulses.  ?   Heart sounds: Normal heart sounds.  ?Pulmonary:  ?   Effort: Pulmonary effort is normal.  ?   Breath sounds: Normal breath sounds.  ?Chest:  ?Breasts: ?   Tanner Score is 5.  ?   Breasts are symmetrical.  ?   Comments: Breasts:  ?      Right: Normal. No swelling, mass, nipple discharge, skin change or tenderness.  ?      Left: Normal. No swelling, mass, nipple discharge, skin change or tenderness.   ?Abdominal:  ?   General: Abdomen is flat. Bowel sounds are normal.  ?   Palpations: Abdomen is soft.   ?Genitourinary: ?   General: Normal vulva.  ?   Rectum: Normal.  ?   Comments: External genitalia without, lice, nits, erythema, edema , lesions or inguinal adenopathy. Vagina with normal mucosa and discharge and pH equal 4.  Cervix without visual lesions, uterus firm, mobile, non-tender, no masses, CMT adnexal fullness or tenderness.  ? ?Musculoskeletal:     ?   General: Normal range of motion.  ?   Cervical back: Normal range of motion and neck supple.  ?Skin: ?   General: Skin is warm and dry.  ?Neurological:  ?   General: No focal deficit present.  ?   Mental Status: She is alert and oriented to person, place, and time.  ?Psychiatric:     ?   Behavior: Behavior normal.  ? ? ? ? ?Assessment and Plan:  ?Lisa Watson is a 36 y.o. female presenting [facility] for an initial annual wellness/contraceptive visit ? ?Contraception counseling: Reviewed options based on patient desire and reproductive life plan. Patient is interested in No Method - No Contraceptive Precautions. This was provided to the patient today.  ? ?Risks, benefits, and typical effectiveness rates were reviewed.  Questions were answered.  Written information was also given to the patient to review.   ? ?The patient will follow up in  as needed for surveillance.  The patient was told to call with any further questions, or with any concerns about this method of contraception.  Emphasized use of condoms 100% of the time for STI prevention. ? ?Need for ECP was assessed. Patient reported Unprotected sex within past 120 hours.  Reviewed options and patient desired No method of ECP, declined all   ? ?1. Smear, vaginal, as part of routine gynecological examination ?Well woman exam  ?Pap today  ?CBE today  ?Discussed importance of screening exams  ?- IGP, Aptima HPV ? ?2. Screening examination for venereal disease ?Patient accepted all screenings including wet prep, oral, vaginal CT/GC and bloodwork for HIV/RPR.  ?Wet prep  results neg    ?No Treatment needed  ?Discussed time line for State Lab results and that patient will be called with positive results and encouraged patient to call if she had not heard in 2 weeks.  ?Counseled to return or seek care for continued or worsening symptoms ?Recommended condom use with all sex ? ?Patient is currently using  no method  to prevent pregnancy.  ?- Chlamydia/Gonorrhea [place] [facility] ?- HIV [place] [facility] ?- WET PREP FOR TRICH, YEAST, CLUE ?- Syphilis Serology, [place] [facility] ?- Hemoglobin, venipuncture ?- Chlamydia/Gonorrhea  Lab ? ?3. Depression, unspecified depression type ?PHQ- 9 result was 8  ?Accepts referral to counseling  ?- Ambulatory referral to Behavioral Health ? ? ? ? ?  Return in about 1 year (around 08/21/2022) for annual well woman exam. ? ?No future appointments. ? ?Wendi Snipes, FNP ? ?

## 2021-08-26 LAB — IGP, APTIMA HPV
HPV Aptima: NEGATIVE
PAP Smear Comment: 0

## 2021-09-02 ENCOUNTER — Encounter: Payer: Self-pay | Admitting: Emergency Medicine

## 2021-09-02 ENCOUNTER — Ambulatory Visit
Admission: EM | Admit: 2021-09-02 | Discharge: 2021-09-02 | Disposition: A | Discharge status: Home / Self Care | Payer: Self-pay | Attending: Urgent Care | Admitting: Urgent Care

## 2021-09-02 DIAGNOSIS — A084 Viral intestinal infection, unspecified: Secondary | ICD-10-CM

## 2021-09-02 DIAGNOSIS — R519 Headache, unspecified: Secondary | ICD-10-CM

## 2021-09-02 DIAGNOSIS — I10 Essential (primary) hypertension: Secondary | ICD-10-CM

## 2021-09-02 DIAGNOSIS — R112 Nausea with vomiting, unspecified: Secondary | ICD-10-CM

## 2021-09-02 LAB — POCT URINALYSIS DIP (MANUAL ENTRY)
Bilirubin, UA: NEGATIVE
Glucose, UA: NEGATIVE mg/dL
Leukocytes, UA: NEGATIVE
Nitrite, UA: NEGATIVE
Protein Ur, POC: 30 mg/dL — AB
Spec Grav, UA: >=1.03 — AB (ref 1.010–1.025)
Urobilinogen, UA: 0.2 E.U./dL
pH, UA: 5.5 (ref 5.0–8.0)

## 2021-09-02 LAB — POCT URINE PREGNANCY: Preg Test, Ur: NEGATIVE

## 2021-09-02 MED ORDER — ONDANSETRON 8 MG PO TBDP: 8.0000 mg | ORAL_TABLET | Freq: Three times a day (TID) | ORAL | 0 refills | Status: AC | PRN

## 2021-09-02 MED ORDER — AMLODIPINE BESYLATE 5 MG PO TABS: 5.0000 mg | ORAL_TABLET | Freq: Every day | ORAL | 0 refills | Status: DC

## 2021-09-02 NOTE — ED Provider Notes (Signed)
?[facility] ? ? ?MRN: [id number] DOB: 11/08/1985 ? ?Subjective:  ? ?Lisa Watson is a 36 y.o. female presenting for 1 day history of acute onset persistent nausea, 1 episode of vomiting, upper abdominal pain, throbbing headache.  Patient cannot specifically say she is not pregnant.  No history of hypertension.  Has never had to take medications for this.  She just had a panel of labs yesterday through her employer and is awaiting results.  Denies chest pain, shortness of breath, bloody stools, diarrhea, constipation, confusion, weakness, numbness or tingling.  No history of stroke.  No drug use, alcohol use.  No smoking. ? ?Denies taking any chronic medications. ? ?Allergies  ?Allergen Reactions  ? Tramadol Itching  ? ? ?Past Medical History:  ?Diagnosis Date  ? Degenerative lumbar disc   ? PCOS (polycystic ovarian syndrome)   ?  ? ?Past Surgical History:  ?Procedure Laterality Date  ? LIPOSUCTION    ? ? ?Family History  ?Problem Relation Age of Onset  ? Cataracts Mother   ? Heart failure Mother   ? Hypertension Father   ? Mental illness Father   ? Clotting disorder Sister   ? Diabetes Brother   ? ? ?Social History  ? ?Tobacco Use  ? Smoking status: Never  ? Smokeless tobacco: Never  ?Vaping Use  ? Vaping Use: Never used  ?Substance Use Topics  ? Alcohol use: Not Currently  ?  Comment: seldom  ? Drug use: Never  ? ? ?ROS ? ? ?Objective:  ? ?Vitals: ?BP (!) 170/122 (BP Location: Right Arm)   Pulse 92   Temp 98.4 ?F (36.9 ?C) (Oral)   Resp 18   LMP 08/05/2021 (Exact Date)   SpO2 97%  ? ?BP Readings from Last 3 Encounters:  ?09/02/21 (!) 170/122  ?08/20/21 (!) 175/107  ?06/10/21 (!) 142/81  ? ?Physical Exam ?Constitutional:   ?   General: She is not in acute distress. ?   Appearance: Normal appearance. She is well-developed. She is not ill-appearing, toxic-appearing or diaphoretic.  ?HENT:  ?   Head: Normocephalic and atraumatic.  ?   Right Ear: External ear normal.  ?   Left Ear: External  ear normal.  ?   Nose: Nose normal.  ?   Mouth/Throat:  ?   Mouth: Mucous membranes are moist.  ?Eyes:  ?   General: No scleral icterus.    ?   Right eye: No discharge.     ?   Left eye: No discharge.  ?   Extraocular Movements: Extraocular movements intact.  ?   Conjunctiva/sclera: Conjunctivae normal.  ?   Pupils: Pupils are equal, round, and reactive to light.  ?Neck:  ?   Meningeal: Brudzinski's sign and Kernig's sign absent.  ?Cardiovascular:  ?   Rate and Rhythm: Normal rate.  ?   Heart sounds: No murmur heard. ?  No friction rub. No gallop.  ?Pulmonary:  ?   Effort: Pulmonary effort is normal. No respiratory distress.  ?   Breath sounds: No stridor. No wheezing, rhonchi or rales.  ?Chest:  ?   Chest wall: No tenderness.  ?Abdominal:  ?   General: Bowel sounds are increased. There is no distension.  ?   Palpations: Abdomen is soft. There is no mass.  ?   Tenderness: There is no abdominal tenderness. There is no right CVA tenderness, left CVA tenderness, guarding or rebound.  ?Musculoskeletal:  ?   Cervical back: No rigidity.  ?Skin: ?  General: Skin is warm and dry.  ?Neurological:  ?   General: No focal deficit present.  ?   Mental Status: She is alert and oriented to person, place, and time.  ?   Cranial Nerves: No cranial nerve deficit, dysarthria or facial asymmetry.  ?   Motor: No weakness.  ?   Coordination: Romberg sign negative. Coordination normal.  ?   Gait: Gait normal.  ?   Deep Tendon Reflexes: Reflexes normal.  ?Psychiatric:     ?   Mood and Affect: Mood normal.     ?   Behavior: Behavior normal.     ?   Thought Content: Thought content normal.     ?   Judgment: Judgment normal.  ? ? ?Results for orders placed or performed during the hospital encounter of 09/02/21 (from the past 24 hour(s))  ?POCT urine pregnancy     Status: Normal  ? Collection Time: 09/02/21 12:11 PM  ?Result Value Ref Range  ? Preg Test, Ur Negative Negative  ? ? ?Assessment and Plan :  ? ?PDMP not reviewed this  encounter. ? ?1. Viral gastroenteritis   ?2. Bad headache   ?3. Nausea and vomiting, unspecified vomiting type   ?4. Essential hypertension   ? ?Will manage for suspected viral gastroenteritis with supportive care.  Recommended patient hydrate well, eat light meals and maintain electrolytes.  Will use Zofran and Imodium for nausea, vomiting and diarrhea.  Chart review shows that she has had elevations in her blood pressure previously.  At this stage, I am comfortable diagnosing essential hypertension.  Recommend she start taking amlodipine.  No signs of an acute encephalopathy, stroke.  Establish care with new PCP.  Counseled patient on potential for adverse effects with medications prescribed/recommended today, ER and return-to-clinic precautions discussed, patient verbalized understanding. ? ?  ?Wallis Bamberg, PA-C ?09/02/21 1229 ? ?

## 2021-09-02 NOTE — Discharge Instructions (Addendum)
Start taking amlodipine to help with your blood pressure.  Make sure you establish care with a new primary care provider.  You can do this at the [facility] website.  If you open up any Internet browser, type in: Primary care and go through the options to select a new PCP through family medicine. ? ?Make sure you push fluids drinking mostly water but mix it with Gatorade.  Try to eat light meals including soups, broths and soft foods, fruits.  You may use Zofran for your nausea and vomiting once every 8 hours.  Imodium can help with diarrhea but use this carefully limiting it to 1-2 times per day only if you are having a lot of diarrhea.  Please return to the clinic if symptoms worsen or you start having severe abdominal pain not helped by taking Tylenol or start having bloody stools or blood in the vomit. ? ? ? ?For diabetes or elevated blood sugar, please make sure you are limiting and avoiding starchy, carbohydrate foods like pasta, breads, sweet breads, pastry, rice, potatoes, desserts. These foods can elevate your blood sugar. Also, limit and avoid drinks that contain a lot of sugar such as sodas, sweet teas, fruit juices.  Drinking plain water will be much more helpful, try 64 ounces of water daily.  It is okay to flavor your water naturally by cutting cucumber, lemon, mint or lime, placing it in a picture with water and drinking it over a period of 24-48 hours as long as it remains refrigerated. ? ?For elevated blood pressure, make sure you are monitoring salt in your diet.  Do not eat restaurant foods and limit processed foods at home. I highly recommend you prepare and cook your own foods at home.  Processed foods include things like frozen meals, pre-seasoned meats and dinners, deli meats, canned foods as these foods contain a high amount of sodium/salt.  Make sure you are paying attention to sodium labels on foods you buy at the grocery store. Buy your spices separately such as garlic powder, onion powder,  cumin, cayenne, parsley flakes so that you can avoid seasonings that contain salt. However, salt-free seasonings are available and can be used, an example is Mrs. Dash and includes a lot of different mixtures that do not contain salt. ? ?Lastly, when cooking using oils that are healthier for you is important. This includes olive oil, avocado oil, canola oil. We have discussed a lot of foods to avoid but below is a list of foods that can be very healthy to use in your diet whether it is for diabetes, cholesterol, high blood pressure, or in general healthy eating. ? ?Salads - kale, spinach, cabbage, spring mix, arugula ?Fruits - avocadoes, berries (blueberries, raspberries, blackberries), apples, oranges, pomegranate, grapefruit, kiwi ?Vegetables - asparagus, cauliflower, broccoli, green beans, brussel sprouts, bell peppers, beets; stay away from or limit starchy vegetables like potatoes, carrots, peas ?Other general foods - kidney beans, egg whites, almonds, walnuts, sunflower seeds, pumpkin seeds, fat free yogurt, almond milk, flax seeds, quinoa, oats  ?Meat - It is better to eat lean meats and limit your red meat including pork to once a week.  Wild caught fish, chicken breast are good options as they tend to be leaner sources of good protein. Still be mindful of the sodium labels for the meats you buy. ? ?DO NOT EAT ANY FOODS ON THIS LIST THAT YOU ARE ALLERGIC TO. For more specific needs, I highly recommend consulting a dietician or nutritionist but this can  definitely be a good starting point. ? ?

## 2021-09-02 NOTE — ED Triage Notes (Addendum)
Pt here with throbbing headache, LUQ and RUQ abdominal pain, and nausea with 1 episode of vomiting since yesterday. Pt is unsure whether she is pregnant or not. ?

## 2022-08-16 ENCOUNTER — Emergency Department: Payer: BC Managed Care – PPO

## 2022-08-16 ENCOUNTER — Emergency Department
Admission: EM | Admit: 2022-08-16 | Discharge: 2022-08-16 | Disposition: A | Discharge status: Home / Self Care | Payer: BC Managed Care – PPO | Attending: Emergency Medicine | Admitting: Emergency Medicine

## 2022-08-16 DIAGNOSIS — I10 Essential (primary) hypertension: Secondary | ICD-10-CM | POA: Diagnosis not present

## 2022-08-16 DIAGNOSIS — R079 Chest pain, unspecified: Secondary | ICD-10-CM | POA: Diagnosis present

## 2022-08-16 LAB — BASIC METABOLIC PANEL
Anion gap: 7 (ref 5–15)
BUN: 15 mg/dL (ref 6–20)
CO2: 25 mmol/L (ref 22–32)
Calcium: 9.2 mg/dL (ref 8.9–10.3)
Chloride: 106 mmol/L (ref 98–111)
Creatinine, Ser: 0.9 mg/dL (ref 0.44–1.00)
GFR, Estimated: >60 mL/min (ref >60)
Glucose, Bld: 92 mg/dL (ref 70–99)
Potassium: 4 mmol/L (ref 3.5–5.1)
Sodium: 138 mmol/L (ref 135–145)

## 2022-08-16 LAB — CBC
HCT: 36 % (ref 36.0–46.0)
Hemoglobin: 11.4 g/dL — ABNORMAL LOW (ref 12.0–15.0)
MCH: 26.1 pg (ref 26.0–34.0)
MCHC: 31.7 g/dL (ref 30.0–36.0)
MCV: 82.4 fL (ref 80.0–100.0)
Platelets: 293 10*3/uL (ref 150–400)
RBC: 4.37 MIL/uL (ref 3.87–5.11)
RDW: 14.3 % (ref 11.5–15.5)
WBC: 11.1 10*3/uL — ABNORMAL HIGH (ref 4.0–10.5)
nRBC: 0 % (ref 0.0–0.2)

## 2022-08-16 LAB — TROPONIN I (HIGH SENSITIVITY): Troponin I (High Sensitivity): 3 ng/L (ref <18)

## 2022-08-16 MED ORDER — AMLODIPINE BESYLATE 5 MG PO TABS: 5.0000 mg | ORAL_TABLET | Freq: Every day | ORAL | 5 refills | Status: DC

## 2022-08-16 MED ORDER — AMLODIPINE BESYLATE 5 MG PO TABS
5.0000 mg | ORAL_TABLET | Freq: Once | ORAL | Status: AC
  Administered 2022-08-16: 5 mg via ORAL
  Filled 2022-08-16: qty 1

## 2022-08-16 NOTE — ED Triage Notes (Signed)
Pt sts she has a hx of HTN but has not sought treatment for it. Pt c/o chest pain for the last 3 days. Pt denies NVD

## 2022-08-16 NOTE — ED Provider Notes (Signed)
[facility] Provider Note    Event Date/Time   First MD Initiated Contact with Patient 08/16/22 2148     (approximate)  History   Chief Complaint: Chest Pain  HPI  Lisa Watson is a 37 y.o. female with no significant past medical history presents to the emergency department for chest pain.  According to the patient for the past 3 days she has been experiencing intermittent chest pain, lasting seconds to a minute or so and then resolving.  Denies any nausea or diaphoresis.  No shortness of breath.  Patient states she has had similar issues in the past as well and was told it could be muscular.  Patient is quite hypertensive currently 185/111, patient states normally her blood pressure is closer to 150.  Does not have a primary care doctor.  And not currently taking any blood pressure medications per patient.  Physical Exam   Triage Vital Signs: ED Triage Vitals [08/16/22 1935]  Enc Vitals Group     BP (!) 181/112     Pulse Rate 91     Resp 18     Temp 98.4 F (36.9 C)     Temp Source Oral     SpO2 100 %     Weight 255 lb (115.7 kg)     Height 5' 7.5" (1.715 m)     Head Circumference      Peak Flow      Pain Score      Pain Loc      Pain Edu?      Excl. in [place]?     Most recent vital signs: Vitals:   08/16/22 1935  BP: (!) 181/112  Pulse: 91  Resp: 18  Temp: 98.4 F (36.9 C)  SpO2: 100%    General: Awake, no distress.  CV:  Good peripheral perfusion.  Regular rate and rhythm  Resp:  Normal effort.  Equal breath sounds bilaterally.  Abd:  No distention.  Soft, nontender.  No rebound or guarding.  ED Results / Procedures / Treatments   EKG  EKG viewed and interpreted by myself shows a normal sinus rhythm at 91 bpm with a narrow QRS, left axis deviation, largely normal intervals, no concerning ST changes.  RADIOLOGY  Chest x-ray viewed and interpreted by myself I do not see any obvious consolidation on my evaluation. Radiology is  read the x-ray is negative   MEDICATIONS ORDERED IN ED: Medications - No data to display   IMPRESSION / MDM / [place] / ED COURSE  I reviewed the triage vital signs and the nursing notes.  Patient's presentation is most consistent with acute presentation with potential threat to life or bodily function.  Patient presents emergency department for intermittent chest pain over the past 3 days.  Overall patient appears well, no distress.  Reassuring physical exam.  Lab work is reassuring with a normal CBC, reassuring chemistry and negative troponin, chest x-ray is clear and EKG reassuring.  Patient is quite hypertensive 181/112 on last check.  Patient was prescribed amlodipine at 1 point but states she no longer takes any medication and does not have a PCP to follow-up with.  Discussed with the patient starting her back on blood pressure medication having her follow-up with a doctor for ongoing treatment and management.  Patient agreeable to plan of care.  I also discussed having her check her blood pressure once or twice a week at a pharmacy to ensure that it does not drop  below 120/80 and if it does she is to discontinue the blood pressure medication.  Patient is agreeable to this plan of care.  Given the patient's reassuring workup I believe the patient is safe for discharge home when emergency standpoint with outpatient follow-up.  FINAL CLINICAL IMPRESSION(S) / ED DIAGNOSES   Chest pain Hypertension  Rx / DC Orders   Amlodipine  Note:  This document was prepared using Dragon voice recognition software and may include unintentional dictation errors.   Harvest Dark, MD 08/16/22 2202

## 2022-08-16 NOTE — ED Triage Notes (Signed)
First Nurse Note:   BIB [facility] from work. Substernal cp x 3 days. Reports hx of anxiety and recent stress to EMS. Reports hx of same and told was muscular.   324 mg ASA given by EMS. 20G PIV to L hand by EMS  185/111- denies hx of HTN HR 85, NSR 99% RA CBG 99 RR 18

## 2022-08-20 ENCOUNTER — Emergency Department: Payer: BC Managed Care – PPO

## 2022-08-20 ENCOUNTER — Other Ambulatory Visit: Payer: Self-pay

## 2022-08-20 ENCOUNTER — Emergency Department
Admission: EM | Admit: 2022-08-20 | Discharge: 2022-08-20 | Disposition: A | Discharge status: Home / Self Care | Payer: BC Managed Care – PPO | Attending: Emergency Medicine | Admitting: Emergency Medicine

## 2022-08-20 DIAGNOSIS — J392 Other diseases of pharynx: Secondary | ICD-10-CM | POA: Insufficient documentation

## 2022-08-20 DIAGNOSIS — M940 Chondrocostal junction syndrome [Tietze]: Secondary | ICD-10-CM | POA: Diagnosis not present

## 2022-08-20 DIAGNOSIS — I10 Essential (primary) hypertension: Secondary | ICD-10-CM | POA: Diagnosis not present

## 2022-08-20 DIAGNOSIS — R0789 Other chest pain: Secondary | ICD-10-CM

## 2022-08-20 LAB — CBC
HCT: 39.3 % (ref 36.0–46.0)
Hemoglobin: 12.4 g/dL (ref 12.0–15.0)
MCH: 25.7 pg — ABNORMAL LOW (ref 26.0–34.0)
MCHC: 31.6 g/dL (ref 30.0–36.0)
MCV: 81.4 fL (ref 80.0–100.0)
Platelets: 313 10*3/uL (ref 150–400)
RBC: 4.83 MIL/uL (ref 3.87–5.11)
RDW: 14.3 % (ref 11.5–15.5)
WBC: 10.7 10*3/uL — ABNORMAL HIGH (ref 4.0–10.5)
nRBC: 0 % (ref 0.0–0.2)

## 2022-08-20 LAB — TROPONIN I (HIGH SENSITIVITY): Troponin I (High Sensitivity): 3 ng/L (ref <18)

## 2022-08-20 LAB — BASIC METABOLIC PANEL
Anion gap: 10 (ref 5–15)
BUN: 12 mg/dL (ref 6–20)
CO2: 23 mmol/L (ref 22–32)
Calcium: 9.5 mg/dL (ref 8.9–10.3)
Chloride: 103 mmol/L (ref 98–111)
Creatinine, Ser: 0.8 mg/dL (ref 0.44–1.00)
GFR, Estimated: >60 mL/min (ref >60)
Glucose, Bld: 112 mg/dL — ABNORMAL HIGH (ref 70–99)
Potassium: 3.4 mmol/L — ABNORMAL LOW (ref 3.5–5.1)
Sodium: 136 mmol/L (ref 135–145)

## 2022-08-20 LAB — D-DIMER, QUANTITATIVE: D-Dimer, Quant: <0.27 ug/mL-FEU (ref 0.00–0.50)

## 2022-08-20 MED ORDER — KETOROLAC TROMETHAMINE 30 MG/ML IJ SOLN
30.0000 mg | Freq: Once | INTRAMUSCULAR | Status: AC
  Administered 2022-08-20: 30 mg via INTRAMUSCULAR
  Filled 2022-08-20: qty 1

## 2022-08-20 MED ORDER — PREDNISONE 10 MG PO TABS: 10.0000 mg | ORAL_TABLET | Freq: Every day | ORAL | 0 refills | Status: DC

## 2022-08-20 MED ORDER — PREDNISONE 10 MG PO TABS: 10.0000 mg | ORAL_TABLET | Freq: Every day | ORAL | 0 refills | Status: AC

## 2022-08-20 NOTE — Discharge Instructions (Addendum)
Please take prednisone as prescribed.  You may take Tylenol for additional pain relief.  Return to the ER for any worsening symptoms or for any urgent changes in your health

## 2022-08-20 NOTE — ED Triage Notes (Signed)
Pt comes from [facility] with c/o cp/ pt states this started few days ago. Pt states mid sternal  to left. Pt denies any N/V. Pt denies any radiation to arm. Pt states some sob.   Pt states it has gotten worse so she came to get checked out.

## 2022-08-20 NOTE — ED Provider Notes (Signed)
EMERGENCY DEPARTMENT AT [facility] Provider Note   CSN: [id number] Arrival date & time: 08/20/22  1533     History  Chief Complaint  Patient presents with   Chest Pain    Lisa Watson is a 37 y.o. female.  With history of anxiety presents to the emergency department for evaluation of chest pain.  She has pain along mid sternum, she describes a sharp pain worse with movement and taking a deep breath the pain goes away at nighttime.  She denies any associated shortness of breath.  She has had a cough.  No diaphoresis, nausea, vomiting, abdominal pain, back pain, lower extremity discomfort or weakness.  She denies any dizziness or lightheadedness.  No headaches.  She was seen in the ER earlier this week, had negative workup and was placed on amlodipine for hypertension.  She states she is been taking the medication as prescribed.  Previous notes reviewed show history of anxiety, patient prescribed Xanax but no records of this in PDMP.  Patient also does not report any records of him taking Xanax.  HPI     Home Medications Prior to Admission medications   Medication Sig Start Date End Date Taking? Authorizing Provider  predniSONE (DELTASONE) 10 MG tablet Take 1 tablet (10 mg total) by mouth daily. 6,5,4,3,2,1 six day taper 08/20/22  Yes Evon Slack, PA-C  amLODipine (NORVASC) 5 MG tablet Take 1 tablet (5 mg total) by mouth daily. 08/16/22   Minna Antis, MD  ondansetron (ZOFRAN-ODT) 8 MG disintegrating tablet Take 1 tablet (8 mg total) by mouth every 8 (eight) hours as needed for nausea or vomiting. 09/02/21   Wallis Bamberg, PA-C      Allergies    Tramadol    Review of Systems   Review of Systems  Physical Exam Updated Vital Signs BP (!) 156/96 (BP Location: Left Arm)   Pulse 95   Temp 98 F (36.7 C)   Resp 19   LMP 07/29/2022 (Exact Date)   SpO2 100%  Physical Exam Constitutional:      Appearance: She is well-developed.  HENT:     Head:  Normocephalic and atraumatic.     Nose: Nose normal.     Mouth/Throat:     Pharynx: Oropharyngeal exudate present. No posterior oropharyngeal erythema.  Eyes:     Conjunctiva/sclera: Conjunctivae normal.  Cardiovascular:     Rate and Rhythm: Normal rate and regular rhythm.     Pulses: Normal pulses.     Heart sounds: Normal heart sounds. No murmur heard. Pulmonary:     Effort: Pulmonary effort is normal. No respiratory distress.     Breath sounds: No wheezing.  Chest:     Chest wall: Tenderness (Moderately tender along the midsternal chest wall with deep palpation.  Patient with significant pain.  No swelling or abscess.  No induration.) present.  Abdominal:     General: Bowel sounds are normal. There is no distension.     Tenderness: There is no abdominal tenderness. There is no guarding.  Musculoskeletal:        General: Normal range of motion.     Cervical back: Normal range of motion.  Skin:    General: Skin is warm.     Capillary Refill: Capillary refill takes less than 2 seconds.     Findings: No rash.  Neurological:     General: No focal deficit present.     Mental Status: She is alert and oriented to person, place, and time.  Psychiatric:        Mood and Affect: Mood normal.        Behavior: Behavior normal.        Thought Content: Thought content normal.     ED Results / Procedures / Treatments   Labs (all labs ordered are listed, but only abnormal results are displayed) Labs Reviewed  BASIC METABOLIC PANEL - Abnormal; Notable for the following components:      Result Value   Potassium 3.4 (*)    Glucose, Bld 112 (*)    All other components within normal limits  CBC - Abnormal; Notable for the following components:   WBC 10.7 (*)    MCH 25.7 (*)    All other components within normal limits  D-DIMER, QUANTITATIVE (NOT AT [facility])  POC URINE PREG, ED  TROPONIN I (HIGH SENSITIVITY)  TROPONIN I (HIGH SENSITIVITY)    EKG None  Radiology DG Chest 2  View  Result Date: 08/20/2022 CLINICAL DATA:  Chest pain EXAM: CHEST - 2 VIEW COMPARISON:  08/16/2022 FINDINGS: The heart size and mediastinal contours are within normal limits. Both lungs are clear. The visualized skeletal structures are unremarkable. IMPRESSION: No active cardiopulmonary disease. Electronically Signed   By: Ernie AvenaPalani  Rathinasamy M.D.   On: 08/20/2022 16:10    Procedures Procedures    Medications Ordered in ED Medications  ketorolac (TORADOL) 30 MG/ML injection 30 mg (30 mg Intramuscular Given 08/20/22 1632)    ED Course/ Medical Decision Making/ A&P                             Medical Decision Making Amount and/or Complexity of Data Reviewed Labs: ordered. Radiology: ordered.  Risk Prescription drug management.   37 year old female with chest pain intermittently for nearly 2 weeks.  She was seen recently emergency department had negative workup.  Today she had a negative cardiac workup.  Troponins, EKGs, chest x-ray, D-dimer, CBC and BMP all within normal limits.  She was given Toradol due to suspected musculoskeletal pain due to tenderness on exam.  History and exam consistent with costochondritis.  She is placed on a prednisone taper.  Vital signs are stable, slightly hypertensive but recently started on amlodipine.  She will continue with amlodipine and continue to monitor symptoms.  She understands signs symptoms return to the ER for. Final Clinical Impression(s) / ED Diagnoses Final diagnoses:  Chest wall pain  Atypical chest pain  Costochondritis    Rx / DC Orders ED Discharge Orders          Ordered    predniSONE (DELTASONE) 10 MG tablet  Daily        08/20/22 1845              Ronnette JuniperGaines, Addilyn Satterwhite C, PA-C 08/20/22 Ruthann [id number]    Wong, Silas, MD 08/20/22 [phone]

## 2022-12-03 ENCOUNTER — Other Ambulatory Visit: Payer: Self-pay

## 2022-12-03 ENCOUNTER — Emergency Department
Admission: EM | Admit: 2022-12-03 | Discharge: 2022-12-03 | Disposition: A | Discharge status: Home / Self Care | Payer: BC Managed Care – PPO | Attending: Emergency Medicine | Admitting: Emergency Medicine

## 2022-12-03 DIAGNOSIS — R519 Headache, unspecified: Secondary | ICD-10-CM | POA: Diagnosis present

## 2022-12-03 DIAGNOSIS — I10 Essential (primary) hypertension: Secondary | ICD-10-CM | POA: Diagnosis not present

## 2022-12-03 DIAGNOSIS — Z79899 Other long term (current) drug therapy: Secondary | ICD-10-CM | POA: Diagnosis not present

## 2022-12-03 DIAGNOSIS — G43809 Other migraine, not intractable, without status migrainosus: Secondary | ICD-10-CM | POA: Diagnosis not present

## 2022-12-03 LAB — BASIC METABOLIC PANEL
Anion gap: 7 (ref 5–15)
BUN: 13 mg/dL (ref 6–20)
CO2: 23 mmol/L (ref 22–32)
Calcium: 8.9 mg/dL (ref 8.9–10.3)
Chloride: 106 mmol/L (ref 98–111)
Creatinine, Ser: 0.77 mg/dL (ref 0.44–1.00)
GFR, Estimated: >60 mL/min (ref >60)
Glucose, Bld: 84 mg/dL (ref 70–99)
Potassium: 3.4 mmol/L — ABNORMAL LOW (ref 3.5–5.1)
Sodium: 136 mmol/L (ref 135–145)

## 2022-12-03 LAB — CBC WITH DIFFERENTIAL/PLATELET
Abs Immature Granulocytes: 0.03 10*3/uL (ref 0.00–0.07)
Basophils Absolute: 0.1 10*3/uL (ref 0.0–0.1)
Basophils Relative: 1 %
Eosinophils Absolute: 0.1 10*3/uL (ref 0.0–0.5)
Eosinophils Relative: 1 %
HCT: 36.5 % (ref 36.0–46.0)
Hemoglobin: 11.7 g/dL — ABNORMAL LOW (ref 12.0–15.0)
Immature Granulocytes: 0 %
Lymphocytes Relative: 29 %
Lymphs Abs: 3.2 10*3/uL (ref 0.7–4.0)
MCH: 25.6 pg — ABNORMAL LOW (ref 26.0–34.0)
MCHC: 32.1 g/dL (ref 30.0–36.0)
MCV: 79.9 fL — ABNORMAL LOW (ref 80.0–100.0)
Monocytes Absolute: 0.8 10*3/uL (ref 0.1–1.0)
Monocytes Relative: 8 %
Neutro Abs: 6.7 10*3/uL (ref 1.7–7.7)
Neutrophils Relative %: 61 %
Platelets: 282 10*3/uL (ref 150–400)
RBC: 4.57 MIL/uL (ref 3.87–5.11)
RDW: 14.6 % (ref 11.5–15.5)
WBC: 10.8 10*3/uL — ABNORMAL HIGH (ref 4.0–10.5)
nRBC: 0 % (ref 0.0–0.2)

## 2022-12-03 LAB — TROPONIN I (HIGH SENSITIVITY): Troponin I (High Sensitivity): 3 ng/L (ref <18)

## 2022-12-03 MED ORDER — DEXAMETHASONE SODIUM PHOSPHATE 10 MG/ML IJ SOLN
10.0000 mg | Freq: Once | INTRAMUSCULAR | Status: AC
  Administered 2022-12-03: 10 mg via INTRAVENOUS
  Filled 2022-12-03: qty 1

## 2022-12-03 MED ORDER — SODIUM CHLORIDE 0.9 % IV BOLUS
1000.0000 mL | Freq: Once | INTRAVENOUS | Status: AC
  Administered 2022-12-03: 1000 mL via INTRAVENOUS

## 2022-12-03 MED ORDER — SUMATRIPTAN SUCCINATE 50 MG PO TABS: 50.0000 mg | ORAL_TABLET | ORAL | 0 refills | Status: AC | PRN

## 2022-12-03 MED ORDER — PROCHLORPERAZINE EDISYLATE 10 MG/2ML IJ SOLN
10.0000 mg | Freq: Once | INTRAMUSCULAR | Status: AC
  Administered 2022-12-03: 10 mg via INTRAVENOUS
  Filled 2022-12-03: qty 2

## 2022-12-03 MED ORDER — AMLODIPINE BESYLATE 5 MG PO TABS: 5.0000 mg | ORAL_TABLET | Freq: Every day | ORAL | 0 refills | Status: AC

## 2022-12-03 MED ORDER — KETOROLAC TROMETHAMINE 15 MG/ML IJ SOLN
15.0000 mg | Freq: Once | INTRAMUSCULAR | Status: AC
  Administered 2022-12-03: 15 mg via INTRAVENOUS
  Filled 2022-12-03: qty 1

## 2022-12-03 NOTE — Discharge Instructions (Signed)
Please take blood pressure medication as prescribed.  Please call and schedule an appointment with PCP.  Check blood pressure daily if possible and record pressures.  Take migraine headache as needed.  Return to the ER for any severe headaches that are different than your normal migraine headaches.  Return to the ER for any vision changes, weakness, worsening symptoms or urgent changes in your health

## 2022-12-03 NOTE — ED Triage Notes (Signed)
Pt states she has had an intermittent headache for the past week, pt denies numbness or weakness. Pt has hx HTN and migraines.

## 2022-12-03 NOTE — ED Provider Notes (Signed)
[place] EMERGENCY DEPARTMENT AT [facility] Provider Note   CSN: [id number] Arrival date & time: 12/03/22  1837     History  Chief Complaint  Patient presents with   Headache    Lisa Watson is a 37 y.o. female with history of high blood pressure, migraine headaches presents to the emergency department for evaluation of her normal migraine headaches.  Patient describes throbbing gradual onset pain in her head with slight nausea.  She tried some over-the-counter medications with no improvement.  She has been prescribed sumatriptan in the past which helps but ran out of this medication.  Patient denies any new or abnormal headache symptoms today.  Headache has been moderate.  No vision changes, weakness.  She also reports some sharp chest pain that is been ongoing intermittently for 1 week.  No shortness of breath.  No cough congestion or runny nose.  No back pain.  Chest pain worse with taking deep breath.  No wheezing.   Patient noted to have elevated blood pressure, 167/119.  Repeat blood pressure as low as 150/105 here in the ED.  Patient states she takes amlodipine 5 mg daily but has been without a PCP and has ran out of the medication.  HPI     Home Medications Prior to Admission medications   Medication Sig Start Date End Date Taking? Authorizing Provider  SUMAtriptan (IMITREX) 50 MG tablet Take 1 tablet (50 mg total) by mouth every 2 (two) hours as needed for migraine. May repeat in 2 hours if headache persists or recurs. No more than 200 mg per 24 hr period 12/03/22  Yes Evon Slack, PA-C  amLODipine (NORVASC) 5 MG tablet Take 1 tablet (5 mg total) by mouth daily. 12/03/22   Evon Slack, PA-C  ondansetron (ZOFRAN-ODT) 8 MG disintegrating tablet Take 1 tablet (8 mg total) by mouth every 8 (eight) hours as needed for nausea or vomiting. 09/02/21   Wallis Bamberg, PA-C  predniSONE (DELTASONE) 10 MG tablet Take 1 tablet (10 mg total) by mouth daily. 6,5,4,3,2,1 six  day taper 08/20/22   Evon Slack, PA-C      Allergies    Tramadol    Review of Systems   Review of Systems  Physical Exam Updated Vital Signs BP (!) 167/119   Pulse 100   Temp 97.9 F (36.6 C) (Oral)   Resp 18   Ht 5\' 7"  (1.702 m)   Wt 114.8 kg   LMP 11/15/2022   SpO2 100%   BMI 39.63 kg/m  Physical Exam Constitutional:      Appearance: Normal appearance. She is well-developed and normal weight.  HENT:     Head: Normocephalic and atraumatic.     Right Ear: External ear normal.     Left Ear: External ear normal.     Nose: Nose normal. No congestion or rhinorrhea.     Mouth/Throat:     Pharynx: Oropharynx is clear. No oropharyngeal exudate or posterior oropharyngeal erythema.  Eyes:     Extraocular Movements: Extraocular movements intact.     Conjunctiva/sclera: Conjunctivae normal.     Pupils: Pupils are equal, round, and reactive to light.  Cardiovascular:     Rate and Rhythm: Normal rate.     Pulses: Normal pulses.  Pulmonary:     Effort: Pulmonary effort is normal. No respiratory distress.  Abdominal:     General: Abdomen is flat. Bowel sounds are normal. There is no distension.     Palpations: Abdomen is soft.  Tenderness: There is no abdominal tenderness. There is no guarding.  Musculoskeletal:        General: Normal range of motion.     Cervical back: Normal range of motion.  Skin:    General: Skin is warm.     Findings: No rash.  Neurological:     General: No focal deficit present.     Mental Status: She is alert and oriented to person, place, and time. Mental status is at baseline.     Cranial Nerves: No cranial nerve deficit or facial asymmetry.     Motor: No weakness.     Gait: Gait normal.  Psychiatric:        Mood and Affect: Mood normal. Mood is not anxious.        Speech: Speech normal.        Behavior: Behavior normal. Behavior is not agitated.        Thought Content: Thought content normal.        Cognition and Memory: Memory is not  impaired.     ED Results / Procedures / Treatments   Labs (all labs ordered are listed, but only abnormal results are displayed) Labs Reviewed  CBC WITH DIFFERENTIAL/PLATELET - Abnormal; Notable for the following components:      Result Value   WBC 10.8 (*)    Hemoglobin 11.7 (*)    MCV 79.9 (*)    MCH 25.6 (*)    All other components within normal limits  BASIC METABOLIC PANEL - Abnormal; Notable for the following components:   Potassium 3.4 (*)    All other components within normal limits  TROPONIN I (HIGH SENSITIVITY)  TROPONIN I (HIGH SENSITIVITY)    EKG None  Radiology No results found.  Procedures Procedures    Medications Ordered in ED Medications  sodium chloride 0.9 % bolus 1,000 mL (0 mLs Intravenous Stopped 12/03/22 2204)  ketorolac (TORADOL) 15 MG/ML injection 15 mg (15 mg Intravenous Given 12/03/22 2206)  prochlorperazine (COMPAZINE) injection 10 mg (10 mg Intravenous Given 12/03/22 2208)  dexamethasone (DECADRON) injection 10 mg (10 mg Intravenous Given 12/03/22 2207)    ED Course/ Medical Decision Making/ A&P                             Medical Decision Making Amount and/or Complexity of Data Reviewed Labs: ordered.  Risk Prescription drug management.  37 year old female with typical migraine headaches presents with migraine headache symptoms.  She denies any new symptoms.  Patient states this is her typical migraine headache.  She has had relief with fluids and IV medications in the ED in the past.  She denies any vision loss.  No neurological deficits on exam.  She did get relief of her headache with IV fluids, Toradol and Compazine.  Patient did mention some sharp chest pain worse with taking a deep breath.  EKG and troponin within normal limits.  Chest pain did resolve with Toradol.  Patient also noted to have high blood pressure here in the ED, has a history of high blood pressure but out of amlodipine.  We will refill her amlodipine today.  Blood  pressure 150/115 at the lowest.  Patient will begin monitoring blood pressure daily, establish care with PCP and discuss blood pressure readings with him to determine if she needs to make any changes to her medications.  She understands strict return precautions to return to the ED for. Final Clinical Impression(s) / ED Diagnoses  Final diagnoses:  Other migraine without status migrainosus, not intractable  Hypertension, unspecified type    Rx / DC Orders ED Discharge Orders          Ordered    amLODipine (NORVASC) 5 MG tablet  Daily        12/03/22 2323    SUMAtriptan (IMITREX) 50 MG tablet  Every 2 hours PRN        12/03/22 2323              Evon Slack, PA-C 12/03/22 2334    Minna Antis, MD 12/07/22 1418

## 2023-01-19 IMAGING — CT CT ANGIO CHEST
2 of 6 series · 17 of 46 positions shown · IV contrast (APPLIED)
Comparison: Chest x-ray from earlier in the same day.

CLINICAL DATA: Persistent chest pain and shortness of breath,
history of RNUNY-KH positivity

EXAM:
CT ANGIOGRAPHY CHEST WITH CONTRAST
TECHNIQUE: Multidetector CT imaging of the chest was performed using the
standard protocol during bolus administration of intravenous
contrast. Multiplanar CT image reconstructions and MIPs were
obtained to evaluate the vascular anatomy.
CONTRAST:  75mL OMNIPAQUE IOHEXOL 350 MG/ML SOLN

[Series 5: thins · axial · 0.71mm/px · z∈[-273,-54]mm · 14 of 241 slices shown]
[im 11/241  lung]
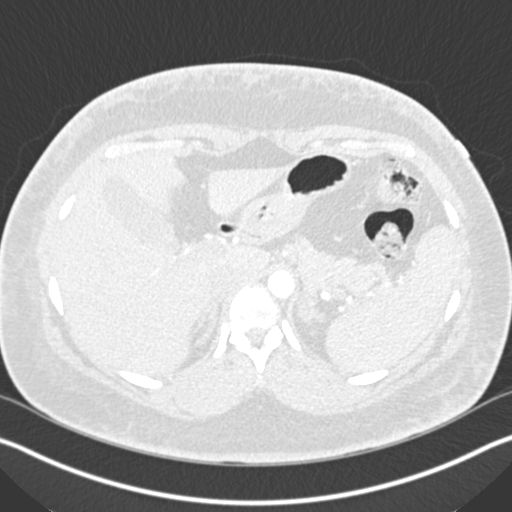
[im 32/241  soft-tissue]
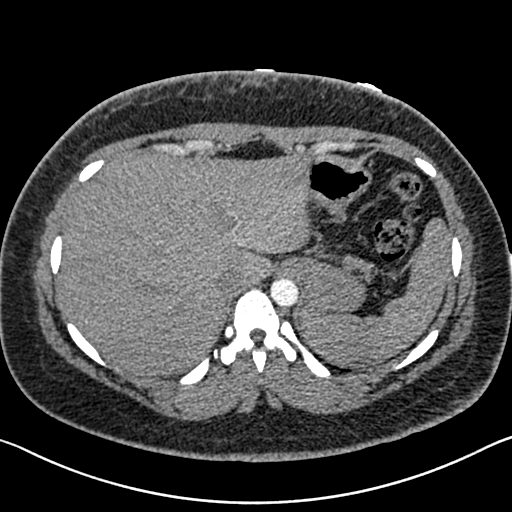
[im 42/241  lung]
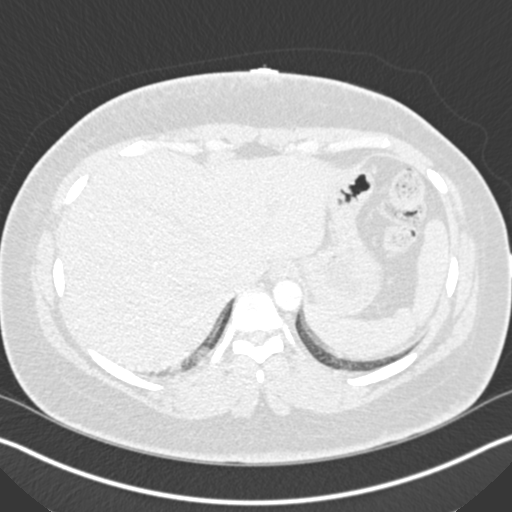
[im 63/241  soft-tissue]
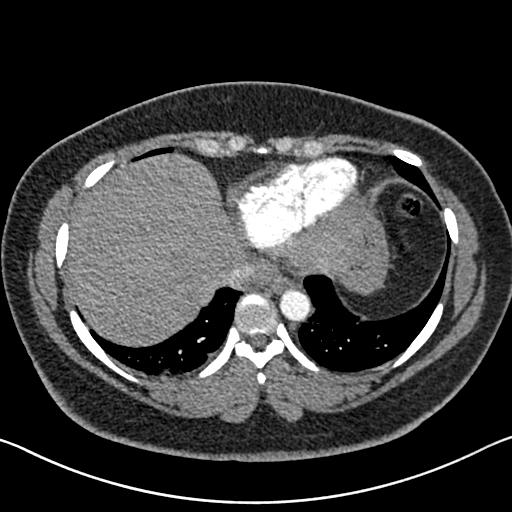
[im 84/241  lung]
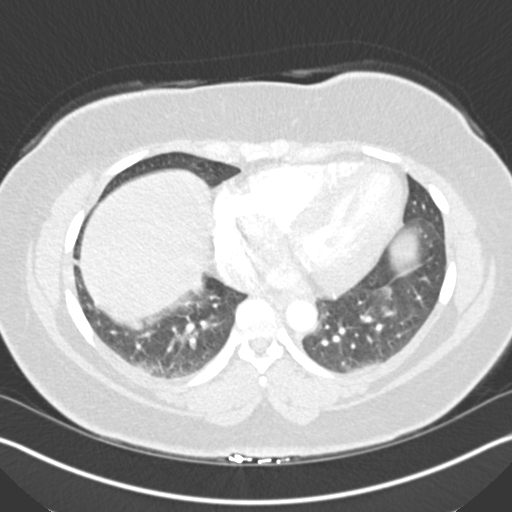
[im 94/241  soft-tissue]
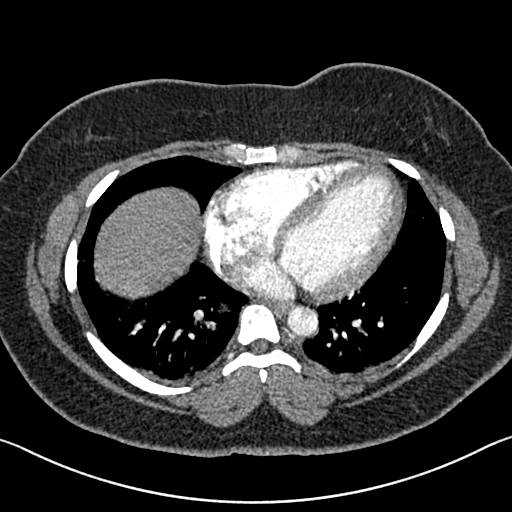
[im 115/241  lung]
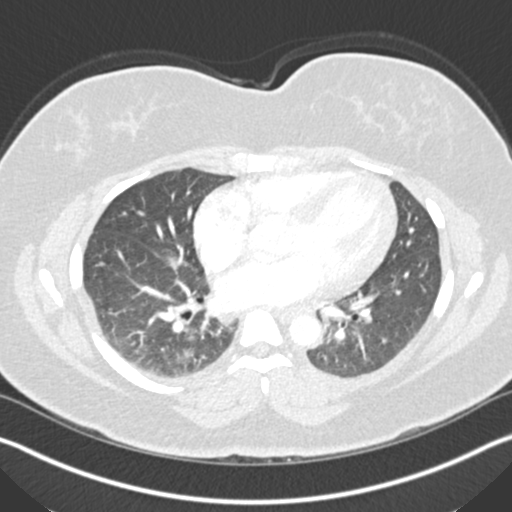
[im 126/241  soft-tissue]
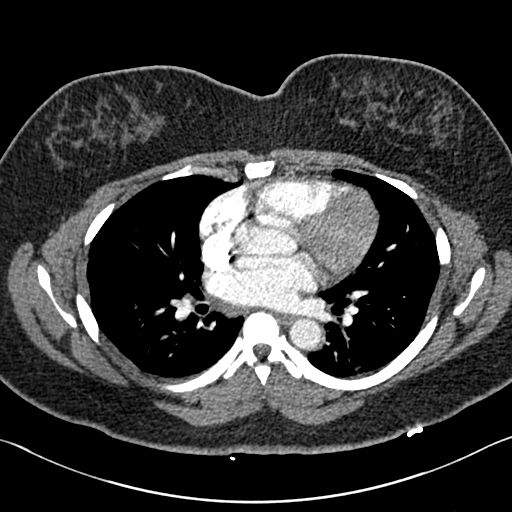
[im 147/241  lung]
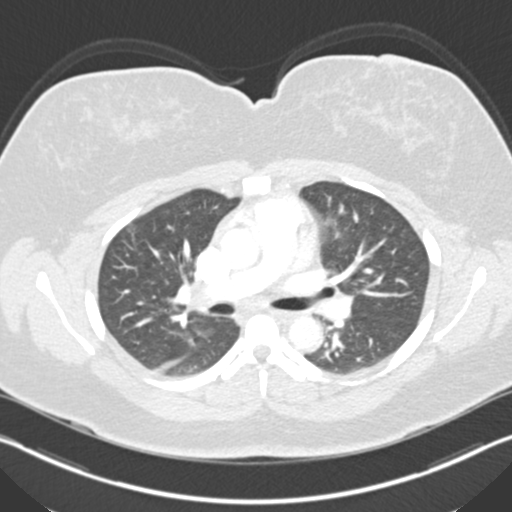
[im 157/241  soft-tissue]
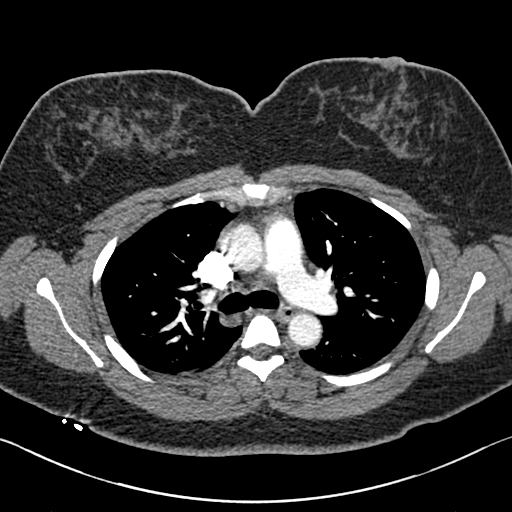
[im 178/241  lung]
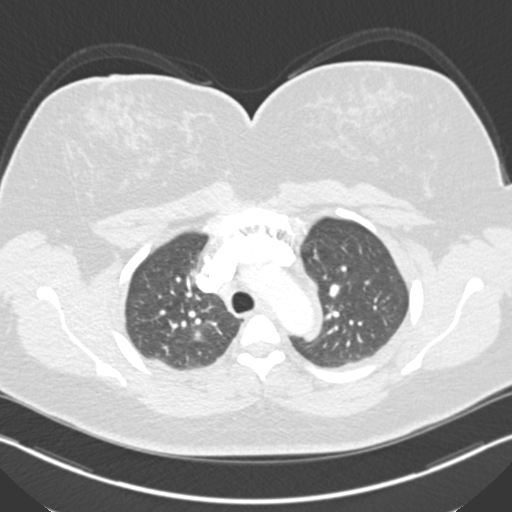
[im 199/241  soft-tissue]
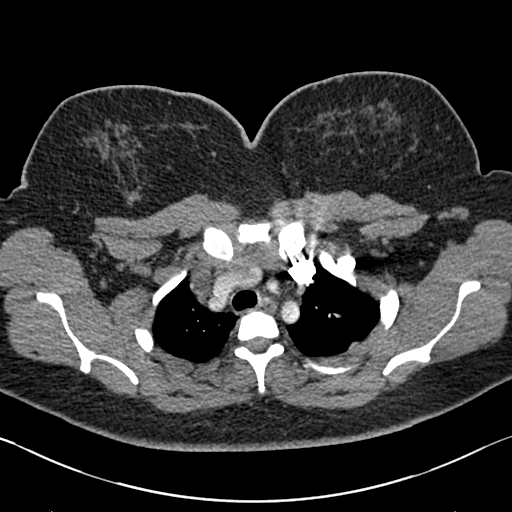
[im 209/241  lung]
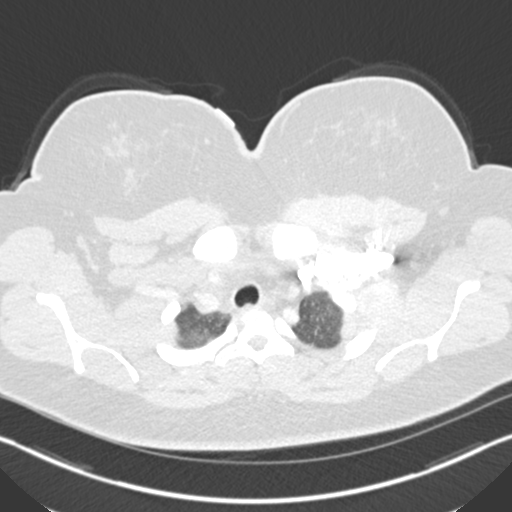
[im 230/241  soft-tissue]
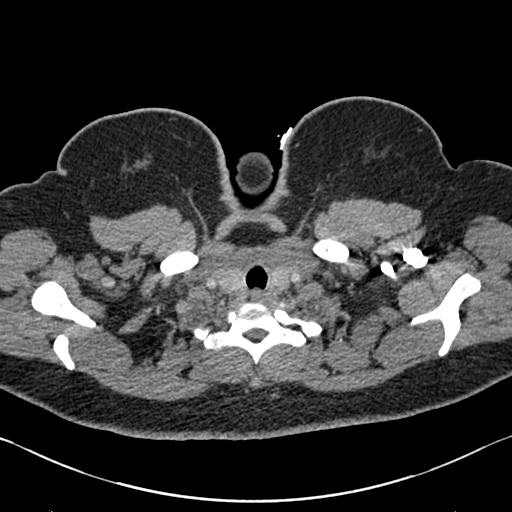

[Series 7: coronal mpr · coronal · 0.52mm/px · 3 of 78 slices shown]
[im 20/78  soft-tissue]
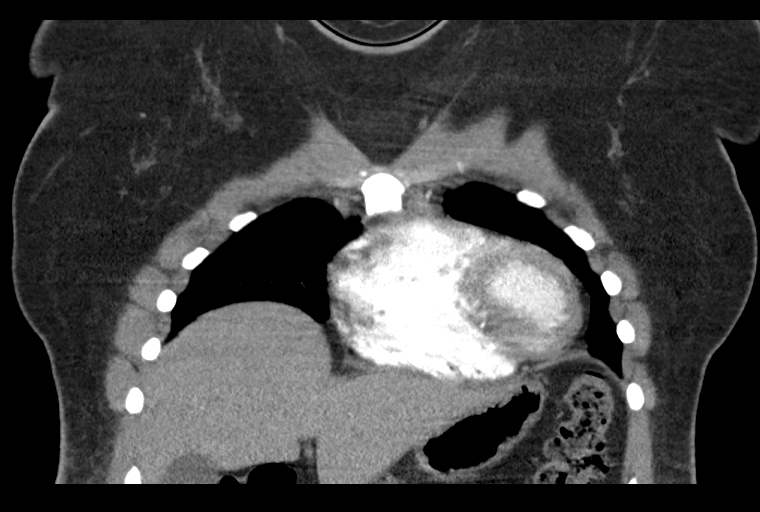
[im 39/78  soft-tissue]
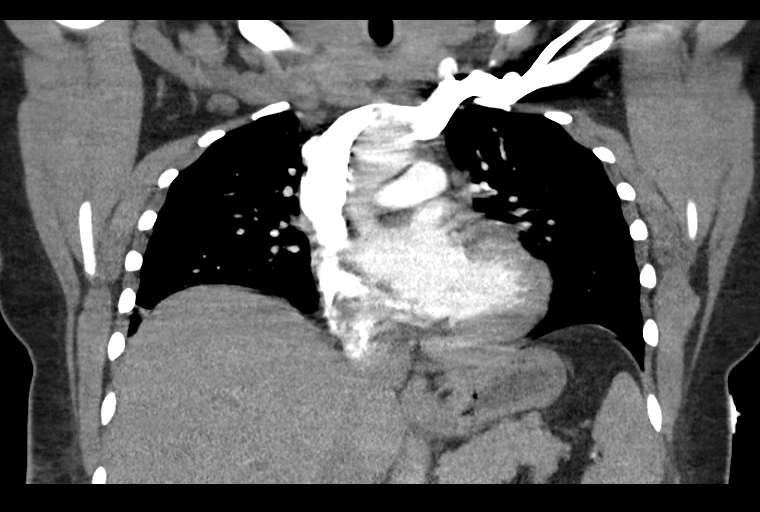
[im 58/78  soft-tissue]
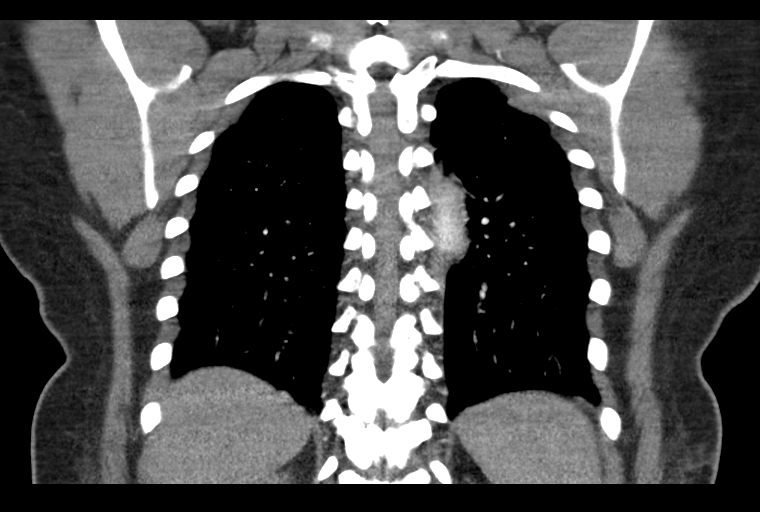

[17 of 46 positions shown; findings below may reference images not displayed]

FINDINGS: Cardiovascular: Thoracic aorta shows a normal branching pattern. No
aneurysmal dilatation or dissection is noted. Cardiac enlargement is
seen. Pulmonary artery is well visualized within normal branching
pattern. No focal filling defect to suggest pulmonary embolism is
noted. No significant coronary calcifications are noted.

Mediastinum/Nodes: Thoracic inlet is within normal limits. Scattered
small hilar and mediastinal lymph nodes are noted likely reactive in
nature. The esophagus as visualized is within normal limits.

Lungs/Pleura: Lungs are well aerated bilaterally. Mild patchy
ground-glass opacities are noted consistent with the given clinical
history of RNUNY-KH positivity. No sizable parenchymal nodules are
seen. No effusion or pneumothorax is seen.

Upper Abdomen: Visualized upper abdomen demonstrates no visceral
abnormality. Mild changes of anasarca are noted in the abdominal
wall.

Musculoskeletal: No chest wall abnormality. No acute or significant
osseous findings.

Review of the MIP images confirms the above findings.
IMPRESSION: No evidence of pulmonary emboli.

Patchy airspace opacity bilaterally consistent with the given
clinical history of RNUNY-KH positivity.

Mild changes of anasarca in the abdominal wall.
